# Patient Record
Sex: Female | Born: 1955 | Race: White | Hispanic: No | Smoking: Never smoker
Health system: Southern US, Community
[De-identification: ages and names within clinical notes are randomized; demographics above are authoritative.]

## PROBLEM LIST (undated history)

## (undated) DIAGNOSIS — M1990 Unspecified osteoarthritis, unspecified site: Secondary | ICD-10-CM

## (undated) DIAGNOSIS — C801 Malignant (primary) neoplasm, unspecified: Secondary | ICD-10-CM

## (undated) DIAGNOSIS — I10 Essential (primary) hypertension: Secondary | ICD-10-CM

## (undated) DIAGNOSIS — N209 Urinary calculus, unspecified: Secondary | ICD-10-CM

## (undated) DIAGNOSIS — K5792 Diverticulitis of intestine, part unspecified, without perforation or abscess without bleeding: Secondary | ICD-10-CM

## (undated) HISTORY — DX: Unspecified osteoarthritis, unspecified site: M19.90

## (undated) HISTORY — DX: Essential (primary) hypertension: I10

## (undated) HISTORY — DX: Urinary calculus, unspecified: N20.9

## (undated) HISTORY — DX: Malignant (primary) neoplasm, unspecified: C80.1

## (undated) HISTORY — DX: Diverticulitis of intestine, part unspecified, without perforation or abscess without bleeding: K57.92

---

## 1998-04-14 ENCOUNTER — Ambulatory Visit (HOSPITAL_COMMUNITY): Admission: RE | Admit: 1998-04-14 | Discharge: 1998-04-14 | Payer: Self-pay | Admitting: Obstetrics and Gynecology

## 1998-04-21 ENCOUNTER — Ambulatory Visit (HOSPITAL_COMMUNITY): Admission: RE | Admit: 1998-04-21 | Discharge: 1998-04-21 | Payer: Self-pay | Admitting: Obstetrics and Gynecology

## 1999-01-17 ENCOUNTER — Other Ambulatory Visit: Admission: RE | Admit: 1999-01-17 | Discharge: 1999-01-17 | Payer: Self-pay | Admitting: Obstetrics and Gynecology

## 1999-04-28 ENCOUNTER — Ambulatory Visit (HOSPITAL_COMMUNITY): Admission: RE | Admit: 1999-04-28 | Discharge: 1999-04-28 | Payer: Self-pay | Admitting: Obstetrics and Gynecology

## 1999-06-25 ENCOUNTER — Emergency Department (HOSPITAL_COMMUNITY): Admission: EM | Admit: 1999-06-25 | Discharge: 1999-06-25 | Payer: Self-pay | Admitting: Emergency Medicine

## 1999-06-25 ENCOUNTER — Encounter: Payer: Self-pay | Admitting: Emergency Medicine

## 2000-03-21 ENCOUNTER — Other Ambulatory Visit: Admission: RE | Admit: 2000-03-21 | Discharge: 2000-03-21 | Payer: Self-pay | Admitting: Obstetrics & Gynecology

## 2000-04-29 ENCOUNTER — Encounter: Payer: Self-pay | Admitting: Obstetrics and Gynecology

## 2000-04-29 ENCOUNTER — Ambulatory Visit (HOSPITAL_COMMUNITY): Admission: RE | Admit: 2000-04-29 | Discharge: 2000-04-29 | Payer: Self-pay | Admitting: Obstetrics and Gynecology

## 2001-05-06 ENCOUNTER — Encounter: Payer: Self-pay | Admitting: Obstetrics and Gynecology

## 2001-05-06 ENCOUNTER — Ambulatory Visit (HOSPITAL_COMMUNITY): Admission: RE | Admit: 2001-05-06 | Discharge: 2001-05-06 | Payer: Self-pay | Admitting: Obstetrics and Gynecology

## 2001-06-18 ENCOUNTER — Other Ambulatory Visit: Admission: RE | Admit: 2001-06-18 | Discharge: 2001-06-18 | Payer: Self-pay | Admitting: Obstetrics and Gynecology

## 2002-05-07 ENCOUNTER — Encounter: Payer: Self-pay | Admitting: Obstetrics and Gynecology

## 2002-05-07 ENCOUNTER — Ambulatory Visit (HOSPITAL_COMMUNITY): Admission: RE | Admit: 2002-05-07 | Discharge: 2002-05-07 | Payer: Self-pay | Admitting: Obstetrics and Gynecology

## 2002-06-24 ENCOUNTER — Other Ambulatory Visit: Admission: RE | Admit: 2002-06-24 | Discharge: 2002-06-24 | Payer: Self-pay | Admitting: Obstetrics and Gynecology

## 2002-12-10 DIAGNOSIS — C801 Malignant (primary) neoplasm, unspecified: Secondary | ICD-10-CM

## 2002-12-10 HISTORY — DX: Malignant (primary) neoplasm, unspecified: C80.1

## 2003-05-18 ENCOUNTER — Ambulatory Visit (HOSPITAL_COMMUNITY): Admission: RE | Admit: 2003-05-18 | Discharge: 2003-05-18 | Payer: Self-pay | Admitting: Obstetrics and Gynecology

## 2003-05-18 ENCOUNTER — Encounter: Payer: Self-pay | Admitting: Obstetrics and Gynecology

## 2004-05-22 ENCOUNTER — Ambulatory Visit (HOSPITAL_COMMUNITY): Admission: RE | Admit: 2004-05-22 | Discharge: 2004-05-22 | Payer: Self-pay | Admitting: Obstetrics and Gynecology

## 2004-08-01 ENCOUNTER — Other Ambulatory Visit: Admission: RE | Admit: 2004-08-01 | Discharge: 2004-08-01 | Payer: Self-pay | Admitting: Obstetrics and Gynecology

## 2005-04-26 ENCOUNTER — Encounter: Admission: RE | Admit: 2005-04-26 | Discharge: 2005-04-26 | Payer: Self-pay | Admitting: Obstetrics and Gynecology

## 2005-11-19 ENCOUNTER — Other Ambulatory Visit: Admission: RE | Admit: 2005-11-19 | Discharge: 2005-11-19 | Payer: Self-pay | Admitting: Obstetrics and Gynecology

## 2006-05-28 ENCOUNTER — Ambulatory Visit (HOSPITAL_COMMUNITY): Admission: RE | Admit: 2006-05-28 | Discharge: 2006-05-28 | Payer: Self-pay | Admitting: Obstetrics and Gynecology

## 2007-05-30 ENCOUNTER — Ambulatory Visit (HOSPITAL_COMMUNITY): Admission: RE | Admit: 2007-05-30 | Discharge: 2007-05-30 | Payer: Self-pay | Admitting: Obstetrics and Gynecology

## 2008-06-02 ENCOUNTER — Ambulatory Visit (HOSPITAL_COMMUNITY): Admission: RE | Admit: 2008-06-02 | Discharge: 2008-06-02 | Payer: Self-pay | Admitting: Obstetrics and Gynecology

## 2009-06-03 ENCOUNTER — Ambulatory Visit (HOSPITAL_COMMUNITY): Admission: RE | Admit: 2009-06-03 | Discharge: 2009-06-03 | Payer: Self-pay | Admitting: Obstetrics and Gynecology

## 2010-06-28 ENCOUNTER — Ambulatory Visit (HOSPITAL_COMMUNITY): Admission: RE | Admit: 2010-06-28 | Discharge: 2010-06-28 | Payer: Self-pay | Admitting: Obstetrics and Gynecology

## 2011-06-06 ENCOUNTER — Other Ambulatory Visit (HOSPITAL_COMMUNITY): Payer: Self-pay | Admitting: Obstetrics and Gynecology

## 2011-06-06 DIAGNOSIS — Z1231 Encounter for screening mammogram for malignant neoplasm of breast: Secondary | ICD-10-CM

## 2011-07-10 ENCOUNTER — Ambulatory Visit (HOSPITAL_COMMUNITY)
Admission: RE | Admit: 2011-07-10 | Discharge: 2011-07-10 | Disposition: A | Discharge status: Home / Self Care | Payer: BC Managed Care – PPO | Source: Ambulatory Visit | Attending: Obstetrics and Gynecology | Admitting: Obstetrics and Gynecology

## 2011-07-10 DIAGNOSIS — Z1231 Encounter for screening mammogram for malignant neoplasm of breast: Secondary | ICD-10-CM

## 2012-04-01 DIAGNOSIS — I10 Essential (primary) hypertension: Secondary | ICD-10-CM

## 2012-04-01 DIAGNOSIS — C4490 Unspecified malignant neoplasm of skin, unspecified: Secondary | ICD-10-CM | POA: Insufficient documentation

## 2012-04-01 DIAGNOSIS — N200 Calculus of kidney: Secondary | ICD-10-CM | POA: Insufficient documentation

## 2012-04-02 ENCOUNTER — Ambulatory Visit: Payer: Self-pay | Admitting: Obstetrics and Gynecology

## 2012-04-03 ENCOUNTER — Ambulatory Visit: Payer: Self-pay | Admitting: Obstetrics and Gynecology

## 2012-06-17 ENCOUNTER — Other Ambulatory Visit: Payer: Self-pay | Admitting: Obstetrics and Gynecology

## 2012-06-17 DIAGNOSIS — Z1231 Encounter for screening mammogram for malignant neoplasm of breast: Secondary | ICD-10-CM

## 2012-07-04 ENCOUNTER — Other Ambulatory Visit: Payer: Self-pay | Admitting: Family Medicine

## 2012-07-10 ENCOUNTER — Ambulatory Visit (HOSPITAL_COMMUNITY)
Admission: RE | Admit: 2012-07-10 | Discharge: 2012-07-10 | Disposition: A | Discharge status: Home / Self Care | Payer: BC Managed Care – PPO | Source: Ambulatory Visit | Attending: Obstetrics and Gynecology | Admitting: Obstetrics and Gynecology

## 2012-07-10 DIAGNOSIS — Z1231 Encounter for screening mammogram for malignant neoplasm of breast: Secondary | ICD-10-CM

## 2012-10-18 ENCOUNTER — Other Ambulatory Visit: Payer: Self-pay | Admitting: Physician Assistant

## 2012-12-19 ENCOUNTER — Encounter: Payer: Self-pay | Admitting: Family Medicine

## 2012-12-19 ENCOUNTER — Ambulatory Visit (INDEPENDENT_AMBULATORY_CARE_PROVIDER_SITE_OTHER): Payer: BC Managed Care – PPO | Admitting: Family Medicine

## 2012-12-19 VITALS — BP 150/90 | HR 63 | Temp 99.3°F | Resp 16 | Ht 61.0 in | Wt 148.8 lb

## 2012-12-19 DIAGNOSIS — IMO0001 Reserved for inherently not codable concepts without codable children: Secondary | ICD-10-CM

## 2012-12-19 DIAGNOSIS — Z79899 Other long term (current) drug therapy: Secondary | ICD-10-CM

## 2012-12-19 DIAGNOSIS — M791 Myalgia, unspecified site: Secondary | ICD-10-CM

## 2012-12-19 DIAGNOSIS — Z0000 Encounter for general adult medical examination without abnormal findings: Secondary | ICD-10-CM

## 2012-12-19 DIAGNOSIS — I10 Essential (primary) hypertension: Secondary | ICD-10-CM

## 2012-12-19 LAB — COMPREHENSIVE METABOLIC PANEL
ALT: 26 U/L (ref 0–35)
Alkaline Phosphatase: 116 U/L (ref 39–117)
BUN: 14 mg/dL (ref 6–23)
CO2: 29 mEq/L (ref 19–32)
Chloride: 104 mEq/L (ref 96–112)
Creat: 0.77 mg/dL (ref 0.50–1.10)
Potassium: 4 mEq/L (ref 3.5–5.3)
Sodium: 137 mEq/L (ref 135–145)
Total Bilirubin: 0.6 mg/dL (ref 0.3–1.2)

## 2012-12-19 LAB — CBC WITH DIFFERENTIAL/PLATELET
Basophils Absolute: 0.1 10*3/uL (ref 0.0–0.1)
Basophils Relative: 1 % (ref 0–1)
Eosinophils Absolute: 0.2 10*3/uL (ref 0.0–0.7)
Eosinophils Relative: 3 % (ref 0–5)
Hemoglobin: 13.9 g/dL (ref 12.0–15.0)
Lymphocytes Relative: 31 % (ref 12–46)
Lymphs Abs: 2 10*3/uL (ref 0.7–4.0)
Monocytes Absolute: 0.3 10*3/uL (ref 0.1–1.0)
Monocytes Relative: 5 % (ref 3–12)
Platelets: 230 10*3/uL (ref 150–400)
RBC: 4.53 MIL/uL (ref 3.87–5.11)
WBC: 6.3 10*3/uL (ref 4.0–10.5)

## 2012-12-19 LAB — C-REACTIVE PROTEIN: CRP: <0.5 mg/dL (ref <0.60)

## 2012-12-19 LAB — TSH: TSH: 1.006 u[IU]/mL (ref 0.350–4.500)

## 2012-12-19 NOTE — Patient Instructions (Signed)

## 2012-12-19 NOTE — Progress Notes (Signed)
Subjective:    Patient ID: Becky Mueller, female    DOB: 1956/04/09, 57 y.o.   MRN: [id number]  HPI  Has suspected that she has fibromyalgia w/ widespread joint and muscle paint for sev yrs.  Might also have arthritis as pain in hands. But muscles very stiff when she gets up after resting. Pressure points tender.  Has been living w/ it but wondering if it is time to augment w/ meds - at least trial. Did see chiropractor as has problems w/ back and neck.  No depression or insomnia so thought it was unlikely.  All of joints and muscles hurts.  Sits all day - but even just pain after 5 min. Gets better with exercises. Does not want to start med today (lyrica). Really wants to make effort to start getting reg exercises - wants to try yoga (although very painful.) No painful, swollen, hot joints as has RA in family. Not fasting. Has colonoscopy (first one) sched for 12/24/2012. Wants to loose some weight. H/o HTN and out of medication for > 1 mo and knows that not on med is usu 140-150/80-90.  Does great on hctz. Has gyn appt next mo, no h/o abnml.   Not tol ca supp - very constipate.  Drinks oj supp ca, cheese, milk, yogurt.  No mvi - does not tolerate. Did get flu shot this yr. Last tetanus prev to here - likey about 7-8 yrs prev. Past Medical History  Diagnosis Date  . Cancer 2004    skin  . Hypertension   . Urolithiasis 1980s  . Arthritis    Past Surgical History  Procedure Date  . Cesarean section     x 2   Family History  Problem Relation Age of Onset  . Cancer Sister 18    breast  . Arthritis Sister     RA  . Early death Paternal Uncle   . Hypertension Mother   . COPD Mother     emphysema   History   Social History  . Marital Status: Married    Spouse Name: N/A    Number of Children: N/A  . Years of Education: N/A   Social History Main Topics  . Smoking status: Never Smoker   . Smokeless tobacco: Never Used  . Alcohol Use: 0.0 oz/week    2-3 drink(s) per week  .  Drug Use: No  . Sexually Active: Yes   Other Topics Concern  . None   Social History Narrative  . None   Current Outpatient Prescriptions on File Prior to Visit  Medication Sig Dispense Refill  . hydrochlorothiazide (HYDRODIURIL) 25 MG tablet TAKE 1 TABLET BY MOUTH EVERY DAY  90 tablet  0   Allergies  Allergen Reactions  . Septra (Bactrim) Hives  . Tetracyclines & Related Hives     Review of Systems  Constitutional: Negative.   HENT: Negative.   Eyes: Negative.   Respiratory: Negative.   Cardiovascular: Negative.   Gastrointestinal: Negative.   Genitourinary: Negative.   Musculoskeletal: Positive for myalgias and arthralgias.  Skin: Negative.   Neurological: Negative.   Hematological: Negative.   Psychiatric/Behavioral: Negative.   All other systems reviewed and are negative.       Objective:   Physical Exam  Constitutional: She is oriented to person, place, and time. She appears well-developed and well-nourished. No distress.  HENT:  Head: Normocephalic and atraumatic.  Right Ear: Tympanic membrane, external ear and ear canal normal.  Left Ear: Tympanic membrane, external ear  and ear canal normal.  Nose: Nose normal. No mucosal edema or rhinorrhea.  Mouth/Throat: Uvula is midline, oropharynx is clear and moist and mucous membranes are normal. No posterior oropharyngeal erythema.  Eyes: Conjunctivae normal and EOM are normal. Pupils are equal, round, and reactive to light. Right eye exhibits no discharge. Left eye exhibits no discharge. No scleral icterus.  Neck: Normal range of motion. Neck supple. No thyromegaly present.  Cardiovascular: Normal rate, regular rhythm, normal heart sounds and intact distal pulses.   Pulmonary/Chest: Effort normal and breath sounds normal. No respiratory distress.  Abdominal: Soft. Bowel sounds are normal. There is no tenderness.  Musculoskeletal: She exhibits no edema.  Lymphadenopathy:    She has no cervical adenopathy.    Neurological: She is alert and oriented to person, place, and time. She has normal reflexes.  Skin: Skin is warm and dry. She is not diaphoretic. No erythema.  Psychiatric: She has a normal mood and affect. Her behavior is normal.       Assessment & Plan:   1. Routine general medical examination at a health care facility  Comprehensive metabolic panel, TSH, CBC with Differential - Pt had colonoscopy on 12/24/12 with 2 polyps and diverticulosis - repeat in 5-10 yrs depending on polyp types. Cont yearly mammograms due to breast cancer in sister.  2. Myalgia  Sedimentation rate, C-reactive protein, CK  3. HTN (hypertension)  Cont hctz  4. Encounter for long-term (current) use of other medications

## 2012-12-20 ENCOUNTER — Other Ambulatory Visit: Payer: Self-pay | Admitting: Physician Assistant

## 2013-01-16 ENCOUNTER — Ambulatory Visit: Payer: BC Managed Care – PPO | Admitting: Family Medicine

## 2013-02-03 ENCOUNTER — Ambulatory Visit: Payer: BC Managed Care – PPO | Admitting: Obstetrics and Gynecology

## 2013-02-03 ENCOUNTER — Encounter: Payer: Self-pay | Admitting: Obstetrics and Gynecology

## 2013-02-03 VITALS — BP 124/66 | Ht 61.0 in | Wt 147.0 lb

## 2013-02-03 DIAGNOSIS — Z01419 Encounter for gynecological examination (general) (routine) without abnormal findings: Secondary | ICD-10-CM

## 2013-02-03 NOTE — Progress Notes (Signed)
The patient is not taking hormone replacement therapy The patient  is not taking a Calcium supplement. Post-menopausal bleeding:no  Last Pap:04/03/2011 Last mammogram:07/11/2012 Normal Last DEXA scan :n/a      Last colonoscopy:per pt 12/2012 Polyps x 2 1) Precancerous ? Repeat every 5 years  Urinary symptoms: none Normal bowel movements: Yes Reports abuse at home: No:   Subjective:    Becky Mueller is a 57 y.o. female [phone] who presents for annual exam.  The patient has no complaints today.   The following portions of the patient's history were reviewed and updated as appropriate: allergies, current medications, past family history, past medical history, past social history, past surgical history and problem list.  Review of Systems Pertinent items are noted in HPI. Gastrointestinal:No change in bowel habits, no abdominal pain, no rectal bleeding Genitourinary:negative for dysuria, frequency, hematuria, nocturia and urinary incontinence    Objective:     BP 124/66  Ht 5\' 1"  (1.549 m)  Wt 147 lb (66.679 kg)  BMI 27.79 kg/m2  LMP 07/24/2011  Weight:  Wt Readings from Last 1 Encounters:  02/03/13 147 lb (66.679 kg)     BMI: Body mass index is 27.79 kg/(m^2). General Appearance: Alert, appropriate appearance for age. No acute distress HEENT: Grossly normal Neck / Thyroid: Supple, no masses, nodes or enlargement Lungs: clear to auscultation bilaterally Back: No CVA tenderness Breast Exam: No masses or nodes.No dimpling, nipple retraction or discharge. Cardiovascular: Regular rate and rhythm. S1, S2, no murmur Gastrointestinal: Soft, non-tender, no masses or organomegaly Pelvic Exam: Vulva and vagina appear normal. Bimanual exam reveals normal uterus and adnexa. Rectovaginal: deferred due to recent colonoscopy Lymphatic Exam: Non-palpable nodes in neck, clavicular, axillary, or inguinal regions Skin: no rash or abnormalities Neurologic: Normal gait and speech, no tremor   Psychiatric: Alert and oriented, appropriate affect.   Assessment:    Normal gyn exam  Newly separated from husband of 15 years and doing very well   Plan:   mammogram pap smear return annually or prn Menopause Symptoms Discussed Declines HRT  Silverio Lay MD

## 2013-02-04 LAB — PAP IG W/ RFLX HPV ASCU

## 2013-04-09 ENCOUNTER — Other Ambulatory Visit: Payer: Self-pay | Admitting: Physician Assistant

## 2013-07-02 ENCOUNTER — Other Ambulatory Visit: Payer: Self-pay | Admitting: Obstetrics and Gynecology

## 2013-07-02 DIAGNOSIS — Z1231 Encounter for screening mammogram for malignant neoplasm of breast: Secondary | ICD-10-CM

## 2013-07-11 ENCOUNTER — Other Ambulatory Visit: Payer: Self-pay | Admitting: Physician Assistant

## 2013-07-14 ENCOUNTER — Other Ambulatory Visit: Payer: Self-pay | Admitting: Family Medicine

## 2013-07-23 ENCOUNTER — Ambulatory Visit (HOSPITAL_COMMUNITY)
Admission: RE | Admit: 2013-07-23 | Discharge: 2013-07-23 | Disposition: A | Discharge status: Home / Self Care | Payer: BC Managed Care – PPO | Source: Ambulatory Visit | Attending: Obstetrics and Gynecology | Admitting: Obstetrics and Gynecology

## 2013-07-23 DIAGNOSIS — Z1231 Encounter for screening mammogram for malignant neoplasm of breast: Secondary | ICD-10-CM

## 2013-08-11 ENCOUNTER — Other Ambulatory Visit: Payer: Self-pay | Admitting: Physician Assistant

## 2013-08-11 ENCOUNTER — Telehealth: Payer: Self-pay

## 2013-08-11 DIAGNOSIS — Z79899 Other long term (current) drug therapy: Secondary | ICD-10-CM

## 2013-08-11 NOTE — Telephone Encounter (Signed)
Dr. Clelia Croft - Pt saw you on January 10 this year.  When she went to get her hydrodiuril filled they told her she needed an OV.  She says that she usually has a year-long refill on this.  Wants to know why that has changed or was it a mistake?  Please call [phone]

## 2013-08-11 NOTE — Telephone Encounter (Signed)
Can she have more refills?

## 2013-08-12 MED ORDER — HYDROCHLOROTHIAZIDE 25 MG PO TABS: ORAL_TABLET | ORAL | Status: DC

## 2013-08-12 NOTE — Telephone Encounter (Signed)
Called her to advise. Left message for her to call me back.  

## 2013-08-12 NOTE — Telephone Encounter (Signed)
When she came in to clinic, she had been out of the medication for over a month so her blood pressure was much to high so I wanted her to come back in 1 month after restarting the medication so we could make sure that the dose was effective to bring her BP down to where it needs to be and to check labs to ensure that the medication was not adversely affecting her kidneys and salts in her blood.  I see her BP at her gynecologist office was fine. Lab only order placed for bmp that pt can come have drawn at her convenience and med refilled.  In the future, it will be easier for her if she is still on the medication at the time of the visit.

## 2013-08-16 NOTE — Telephone Encounter (Signed)
LMOM to CB. 

## 2013-08-17 ENCOUNTER — Telehealth: Payer: Self-pay

## 2013-08-17 NOTE — Telephone Encounter (Signed)
PT WOULD LIKE TO SPEAK WITH SOMEONE REGARDING HER MEDICATION. PLEASE CALL [phone]

## 2013-08-17 NOTE — Telephone Encounter (Signed)
Called her, left message for call back

## 2013-08-18 NOTE — Telephone Encounter (Signed)
Patient has not returned our calls

## 2013-08-20 NOTE — Telephone Encounter (Signed)
Called her, she states she does not need anything, she is taking HCTZ, she indicates she can not come in for recheck in 6 months her insurance will not cover this, she wants to know if you can fill the meds and she can come in yearly her BP have been about 120/80. Advised her the Rx was sent in for 6 months, so she will be fine for 6 more months. Patient will come in Jan for recheck

## 2013-10-15 ENCOUNTER — Other Ambulatory Visit: Payer: Self-pay

## 2014-06-23 ENCOUNTER — Other Ambulatory Visit: Payer: Self-pay | Admitting: Family Medicine

## 2014-06-23 NOTE — Telephone Encounter (Signed)
Phone call to patient, have gotten med refill request/ she is very overdue for follow up, what is her follow up plan? Left message

## 2014-06-24 NOTE — Telephone Encounter (Signed)
TRIED TO CALL PT- UNABLE TO LEAVE MESSAGE NEEDS APPT FOR FURTHER REFILLS

## 2014-07-16 ENCOUNTER — Ambulatory Visit (INDEPENDENT_AMBULATORY_CARE_PROVIDER_SITE_OTHER): Payer: BC Managed Care – PPO | Admitting: Family Medicine

## 2014-07-16 ENCOUNTER — Encounter: Payer: Self-pay | Admitting: Family Medicine

## 2014-07-16 ENCOUNTER — Other Ambulatory Visit: Payer: Self-pay | Admitting: Family Medicine

## 2014-07-16 VITALS — BP 106/70 | HR 71 | Temp 97.9°F | Resp 16 | Ht 61.5 in | Wt 145.6 lb

## 2014-07-16 DIAGNOSIS — Z79899 Other long term (current) drug therapy: Secondary | ICD-10-CM

## 2014-07-16 DIAGNOSIS — M129 Arthropathy, unspecified: Secondary | ICD-10-CM

## 2014-07-16 DIAGNOSIS — M1990 Unspecified osteoarthritis, unspecified site: Secondary | ICD-10-CM

## 2014-07-16 DIAGNOSIS — E663 Overweight: Secondary | ICD-10-CM

## 2014-07-16 DIAGNOSIS — G8929 Other chronic pain: Secondary | ICD-10-CM

## 2014-07-16 DIAGNOSIS — I10 Essential (primary) hypertension: Secondary | ICD-10-CM

## 2014-07-16 LAB — TSH: TSH: 1.105 u[IU]/mL (ref 0.350–4.500)

## 2014-07-16 LAB — COMPREHENSIVE METABOLIC PANEL
ALBUMIN: 4.1 g/dL (ref 3.5–5.2)
ALT: 12 U/L (ref 0–35)
AST: 15 U/L (ref 0–37)
Alkaline Phosphatase: 93 U/L (ref 39–117)
BILIRUBIN TOTAL: 0.6 mg/dL (ref 0.2–1.2)
BUN: 17 mg/dL (ref 6–23)
CALCIUM: 9.8 mg/dL (ref 8.4–10.5)
CO2: 30 meq/L (ref 19–32)
Chloride: 102 mEq/L (ref 96–112)
Creat: 0.77 mg/dL (ref 0.50–1.10)
Glucose, Bld: 145 mg/dL — ABNORMAL HIGH (ref 70–99)
POTASSIUM: 3.7 meq/L (ref 3.5–5.3)
SODIUM: 142 meq/L (ref 135–145)
TOTAL PROTEIN: 7.1 g/dL (ref 6.0–8.3)

## 2014-07-16 LAB — CBC
HCT: 40.5 % (ref 36.0–46.0)
Hemoglobin: 14 g/dL (ref 12.0–15.0)
MCH: 30.7 pg (ref 26.0–34.0)
MCHC: 34.6 g/dL (ref 30.0–36.0)
MCV: 88.8 fL (ref 78.0–100.0)
Platelets: 235 10*3/uL (ref 150–400)
RBC: 4.56 MIL/uL (ref 3.87–5.11)
RDW: 12.8 % (ref 11.5–15.5)
WBC: 5.2 10*3/uL (ref 4.0–10.5)

## 2014-07-16 MED ORDER — HYDROCHLOROTHIAZIDE 25 MG PO TABS: ORAL_TABLET | ORAL | Status: DC

## 2014-07-16 NOTE — Progress Notes (Deleted)
Subjective:  This chart was scribed for Delman Cheadle, MD by Thea Alken, ED Scribe. This patient was seen in room 25 and the patient's care was started at 8:26 AM.   Patient ID: Becky Mueller, female    DOB: May 12, 1956, 58 y.o.   MRN: [id number]  HPI Chief Complaint  Patient presents with   Medication Refill    HCTZ   HPI Comments: Becky Mueller is a 58 y.o. female who presents to the [facility] for a medication refill regarding hydrochlorothiazide. Pt is still taking medication and reports it is working well. She denies checking BP outside the office. Pt has a BP cuff at home but is unsure where it is. She reports she is willing to find it and check her BP at home twice a day. She feels at time her BP is low due to feeling dizzy esp with fast position changes.   Pt report she is still in pain. Pt believes this pain may be arthritis or fibromyalgia. She reports pain is relieved when on a gluten free diet, as she has done this multiple times in the past. She has a goal to start exercising. Pt reports her weight has been steady but would like to lose 2 lbs a month. Pt goal weight is 130 lbs.   Past Medical History  Diagnosis Date   Cancer 2004    skin   Hypertension    Urolithiasis 1980s   Arthritis    Diverticulitis    Past Surgical History  Procedure Laterality Date   Cesarean section      x 2   Prior to Admission medications   Medication Sig Start Date End Date Taking? Authorizing Provider  hydrochlorothiazide (HYDRODIURIL) 25 MG tablet TAKE 1 TABLET DAILY NEED OFFICE VISIT FOR FURTHER REFILLS 06/24/14   Fara Chute, PA-C   Review of Systems  Constitutional: Negative for fever, chills, activity change, appetite change and unexpected weight change.  Respiratory: Negative for chest tightness and shortness of breath.   Cardiovascular: Negative for chest pain, palpitations and leg swelling.  Musculoskeletal: Positive for arthralgias and myalgias.    Neurological: Positive for dizziness and light-headedness. Negative for headaches.    Objective:   Physical Exam  Nursing note and vitals reviewed. Constitutional: She is oriented to person, place, and time. She appears well-developed and well-nourished. No distress.  HENT:  Head: Normocephalic and atraumatic.  Eyes: Conjunctivae and EOM are normal.  Neck: Normal range of motion. Neck supple. No thyromegaly present.  Cardiovascular: Normal rate, regular rhythm and normal heart sounds.  Exam reveals no gallop and no friction rub.   No murmur heard. Pulmonary/Chest: Effort normal and breath sounds normal. No respiratory distress. She has no wheezes. She has no rales. She exhibits no tenderness.  Musculoskeletal: Normal range of motion.  Lymphadenopathy:    She has no cervical adenopathy.  Neurological: She is alert and oriented to person, place, and time.  Skin: Skin is warm and dry.  Psychiatric: She has a normal mood and affect. Her behavior is normal.   Filed Vitals:   07/16/14 0806  BP: 106/70  Pulse: 71  Temp: 97.9 F (36.6 C)  Resp: 16      Assessment & Plan:   1. Essential hypertension   2. Chronic pain   3. Arthritis   4. Encounter for long-term (current) use of other medications   5. Overweight (BMI 25.0-29.9)   8:41 AM-Discussed treatment plan which includes taking half of  HCTZ tablet daily, and monitor BP at home. If BP is doing well pt can stop taking HCTZ and continue monitoring BP. If BP increases pt has been advised to increase dose back to full 25 mg of HCTZ..  Meds ordered this encounter  Medications   hydrochlorothiazide (HYDRODIURIL) 25 MG tablet    Sig: TAKE 1/2 TABLET DAILY    Dispense:  30 tablet    Refill:  0   Orders Placed This Encounter  Procedures   Comprehensive metabolic panel   CBC   TSH   I personally performed the services described in this documentation, which was scribed in my presence. The recorded information has been  reviewed and considered, and addended by me as needed.  Delman Cheadle, MD MPH

## 2014-07-16 NOTE — Patient Instructions (Addendum)
Cut the hctz in 1/2 for about 3-4 weeks and check BP 1-2x/wk.  As long as it is remaining <120/80 most of the time, then try stopping it all together and continue to monitor your blood pressure. Please email through [facility] and let me know how it is going and what your pressures are running generally. If you end up needing to be on the hctz or even just 1/2 tab daily, let me know and I will send in a years supply for you. Loosing 20 lbs in the next year could be HUGE for you as far as decreased pain and increased energy.  Exercise for 30-40 minutes at lease over other day. Managing Your High Blood Pressure Blood pressure is a measurement of how forceful your blood is pressing against the walls of the arteries. Arteries are muscular tubes within the circulatory system. Blood pressure does not stay the same. Blood pressure rises when you are active, excited, or nervous; and it lowers during sleep and relaxation. If the numbers measuring your blood pressure stay above normal most of the time, you are at risk for health problems. High blood pressure (hypertension) is a long-term (chronic) condition in which blood pressure is elevated. A blood pressure reading is recorded as two numbers, such as 120 over 80 (or 120/80). The first, higher number is called the systolic pressure. It is a measure of the pressure in your arteries as the heart beats. The second, lower number is called the diastolic pressure. It is a measure of the pressure in your arteries as the heart relaxes between beats.  Keeping your blood pressure in a normal range is important to your overall health and prevention of health problems, such as heart disease and stroke. When your blood pressure is uncontrolled, your heart has to work harder than normal. High blood pressure is a very common condition in adults because blood pressure tends to rise with age. Men and women are equally likely to have hypertension but at different times in life. Before age  29, men are more likely to have hypertension. After 56 years of age, women are more likely to have it. Hypertension is especially common in African Americans. This condition often has no signs or symptoms. The cause of the condition is usually not known. Your caregiver can help you come up with a plan to keep your blood pressure in a normal, healthy range. BLOOD PRESSURE STAGES Blood pressure is classified into four stages: normal, prehypertension, stage 1, and stage 2. Your blood pressure reading will be used to determine what type of treatment, if any, is necessary. Appropriate treatment options are tied to these four stages:  Normal  Systolic pressure (mm Hg): below 120.  Diastolic pressure (mm Hg): below 80. Prehypertension  Systolic pressure (mm Hg): 120 to 139.  Diastolic pressure (mm Hg): 80 to 89. Stage1  Systolic pressure (mm Hg): 140 to 159.  Diastolic pressure (mm Hg): 90 to 99. Stage2  Systolic pressure (mm Hg): 160 or above.  Diastolic pressure (mm Hg): 100 or above. RISKS RELATED TO HIGH BLOOD PRESSURE Managing your blood pressure is an important responsibility. Uncontrolled high blood pressure can lead to:  A heart attack.  A stroke.  A weakened blood vessel (aneurysm).  Heart failure.  Kidney damage.  Eye damage.  Metabolic syndrome.  Memory and concentration problems. HOW TO MANAGE YOUR BLOOD PRESSURE Blood pressure can be managed effectively with lifestyle changes and medicines (if needed). Your caregiver will help you come up with a  plan to bring your blood pressure within a normal range. Your plan should include the following: Education  Read all information provided by your caregivers about how to control blood pressure.  Educate yourself on the latest guidelines and treatment recommendations. New research is always being done to further define the risks and treatments for high blood pressure. Lifestylechanges  Control your weight.  Avoid  smoking.  Stay physically active.  Reduce the amount of salt in your diet.  Reduce stress.  Control any chronic conditions, such as high cholesterol or diabetes.  Reduce your alcohol intake. Medicines  Several medicines (antihypertensive medicines) are available, if needed, to bring blood pressure within a normal range. Communication  Review all the medicines you take with your caregiver because there may be side effects or interactions.  Talk with your caregiver about your diet, exercise habits, and other lifestyle factors that may be contributing to high blood pressure.  See your caregiver regularly. Your caregiver can help you create and adjust your plan for managing high blood pressure. RECOMMENDATIONS FOR TREATMENT AND FOLLOW-UP  The following recommendations are based on current guidelines for managing high blood pressure in nonpregnant adults. Use these recommendations to identify the proper follow-up period or treatment option based on your blood pressure reading. You can discuss these options with your caregiver.  Systolic pressure of 976 to 734 or diastolic pressure of 80 to 89: Follow up with your caregiver as directed.  Systolic pressure of 193 to 790 or diastolic pressure of 90 to 100: Follow up with your caregiver within 2 months.  Systolic pressure above 240 or diastolic pressure above 973: Follow up with your caregiver within 1 month.  Systolic pressure above 532 or diastolic pressure above 992: Consider antihypertensive therapy; follow up with your caregiver within 1 week.  Systolic pressure above 426 or diastolic pressure above 834: Begin antihypertensive therapy; follow up with your caregiver within 1 week. Document Released: 08/20/2012 Document Reviewed: 08/20/2012 [facility] Patient Information 2015 [place]. This information is not intended to replace advice given to you by your health care provider. Make sure you discuss any questions you have with your  health care provider. Hypertension Hypertension, commonly called high blood pressure, is when the force of blood pumping through your arteries is too strong. Your arteries are the blood vessels that carry blood from your heart throughout your body. A blood pressure reading consists of a higher number over a lower number, such as 110/72. The higher number (systolic) is the pressure inside your arteries when your heart pumps. The lower number (diastolic) is the pressure inside your arteries when your heart relaxes. Ideally you want your blood pressure below 120/80. Hypertension forces your heart to work harder to pump blood. Your arteries may become narrow or stiff. Having hypertension puts you at risk for heart disease, stroke, and other problems.  RISK FACTORS Some risk factors for high blood pressure are controllable. Others are not.  Risk factors you cannot control include:   Race. You may be at higher risk if you are African American.  Age. Risk increases with age.  Gender. Men are at higher risk than women before age 85 years. After age 90, women are at higher risk than men. Risk factors you can control include:  Not getting enough exercise or physical activity.  Being overweight.  Getting too much fat, sugar, calories, or salt in your diet.  Drinking too much alcohol. SIGNS AND SYMPTOMS Hypertension does not usually cause signs or symptoms. Extremely high  blood pressure (hypertensive crisis) may cause headache, anxiety, shortness of breath, and nosebleed. DIAGNOSIS  To check if you have hypertension, your health care provider will measure your blood pressure while you are seated, with your arm held at the level of your heart. It should be measured at least twice using the same arm. Certain conditions can cause a difference in blood pressure between your right and left arms. A blood pressure reading that is higher than normal on one occasion does not mean that you need treatment. If one  blood pressure reading is high, ask your health care provider about having it checked again. TREATMENT  Treating high blood pressure includes making lifestyle changes and possibly taking medicine. Living a healthy lifestyle can help lower high blood pressure. You may need to change some of your habits. Lifestyle changes may include:  Following the DASH diet. This diet is high in fruits, vegetables, and whole grains. It is low in salt, red meat, and added sugars.  Getting at least 2 hours of brisk physical activity every week.  Losing weight if necessary.  Not smoking.  Limiting alcoholic beverages.  Learning ways to reduce stress. If lifestyle changes are not enough to get your blood pressure under control, your health care provider may prescribe medicine. You may need to take more than one. Work closely with your health care provider to understand the risks and benefits. HOME CARE INSTRUCTIONS  Have your blood pressure rechecked as directed by your health care provider.   Take medicines only as directed by your health care provider. Follow the directions carefully. Blood pressure medicines must be taken as prescribed. The medicine does not work as well when you skip doses. Skipping doses also puts you at risk for problems.   Do not smoke.   Monitor your blood pressure at home as directed by your health care provider. SEEK MEDICAL CARE IF:   You think you are having a reaction to medicines taken.  You have recurrent headaches or feel dizzy.  You have swelling in your ankles.  You have trouble with your vision. SEEK IMMEDIATE MEDICAL CARE IF:  You develop a severe headache or confusion.  You have unusual weakness, numbness, or feel faint.  You have severe chest or abdominal pain.  You vomit repeatedly.  You have trouble breathing. MAKE SURE YOU:   Understand these instructions.  Will watch your condition.  Will get help right away if you are not doing well or  get worse. Document Released: 11/26/2005 Document Revised: 04/12/2014 Document Reviewed: 09/18/2013 [facility] Patient Information 2015 [place], [place]. This information is not intended to replace advice given to you by your health care provider. Make sure you discuss any questions you have with your health care provider.

## 2014-07-19 LAB — HEMOGLOBIN A1C
HEMOGLOBIN A1C: 6.4 % — AB (ref <5.7)
MEAN PLASMA GLUCOSE: 137 mg/dL — AB (ref <117)

## 2014-07-19 NOTE — Progress Notes (Signed)
Subjective:  This chart was scribed for Delman Cheadle, MD by Thea Alken, ED Scribe. This patient was seen in room 25 and the patient's care was started at 8:26 AM.   Patient ID: Becky Mueller, female    DOB: 01-03-1956, 58 y.o.   MRN: [id number]  HPI Chief Complaint  Patient presents with  . Medication Refill    HCTZ   HPI Comments: Becky Mueller is a 58 y.o. female who presents to the [facility] for a medication refill regarding hydrochlorothiazide. Pt is still taking medication and reports it is working well. She denies checking BP outside the office. Pt has a BP cuff at home but is unsure where it is. She reports she is willing to find it and check her BP at home twice a day. She feels at time her BP is low due to feeling dizzy esp with fast position changes.   Pt report she is still in pain. Pt believes this pain may be arthritis or fibromyalgia. She reports pain is relieved when on a gluten free diet, as she has done this multiple times in the past. She has a goal to start exercising. Pt reports her weight has been steady but would like to lose 2 lbs a month. Pt goal weight is 130 lbs.   Past Medical History  Diagnosis Date  . Cancer 2004    skin  . Hypertension   . Urolithiasis 1980s  . Arthritis   . Diverticulitis    Past Surgical History  Procedure Laterality Date  . Cesarean section      x 2   Prior to Admission medications   Medication Sig Start Date End Date Taking? Authorizing Provider  hydrochlorothiazide (HYDRODIURIL) 25 MG tablet TAKE 1 TABLET DAILY NEED OFFICE VISIT FOR FURTHER REFILLS 06/24/14   Fara Chute, PA-C   Review of Systems  Constitutional: Negative for fever, chills, activity change, appetite change and unexpected weight change.  Respiratory: Negative for chest tightness and shortness of breath.   Cardiovascular: Negative for chest pain, palpitations and leg swelling.  Musculoskeletal: Positive for arthralgias and myalgias.    Neurological: Positive for dizziness and light-headedness. Negative for headaches.    Objective:   Physical Exam  Nursing note and vitals reviewed. Constitutional: She is oriented to person, place, and time. She appears well-developed and well-nourished. No distress.  HENT:  Head: Normocephalic and atraumatic.  Eyes: Conjunctivae and EOM are normal.  Neck: Normal range of motion. Neck supple. No thyromegaly present.  Cardiovascular: Normal rate, regular rhythm and normal heart sounds.  Exam reveals no gallop and no friction rub.   No murmur heard. Pulmonary/Chest: Effort normal and breath sounds normal. No respiratory distress. She has no wheezes. She has no rales. She exhibits no tenderness.  Musculoskeletal: Normal range of motion.  Lymphadenopathy:    She has no cervical adenopathy.  Neurological: She is alert and oriented to person, place, and time.  Skin: Skin is warm and dry.  Psychiatric: She has a normal mood and affect. Her behavior is normal.   Filed Vitals:   07/16/14 0806  BP: 106/70  Pulse: 71  Temp: 97.9 F (36.6 C)  Resp: 16      Assessment & Plan:   1. Essential hypertension   2. Chronic pain   3. Arthritis   4. Encounter for long-term (current) use of other medications   5. Overweight (BMI 25.0-29.9)   8:41 AM-Discussed treatment plan which includes taking half of  HCTZ tablet daily, and monitor BP at home. If BP is doing well pt can stop taking HCTZ and continue monitoring BP. If BP increases pt has been advised to increase dose back to full 25 mg of HCTZ..  Meds ordered this encounter  Medications  . hydrochlorothiazide (HYDRODIURIL) 25 MG tablet    Sig: TAKE 1/2 TABLET DAILY    Dispense:  30 tablet    Refill:  0   Orders Placed This Encounter  Procedures  . Comprehensive metabolic panel  . CBC  . TSH   I personally performed the services described in this documentation, which was scribed in my presence. The recorded information has been  reviewed and considered, and addended by me as needed.  Delman Cheadle, MD MPH

## 2014-07-20 ENCOUNTER — Ambulatory Visit (INDEPENDENT_AMBULATORY_CARE_PROVIDER_SITE_OTHER): Payer: BC Managed Care – PPO | Admitting: Family Medicine

## 2014-07-20 VITALS — BP 108/72 | HR 66 | Temp 97.8°F | Resp 16 | Ht 61.5 in | Wt 147.0 lb

## 2014-07-20 DIAGNOSIS — R300 Dysuria: Secondary | ICD-10-CM

## 2014-07-20 DIAGNOSIS — R3129 Other microscopic hematuria: Secondary | ICD-10-CM

## 2014-07-20 LAB — POCT URINALYSIS DIPSTICK
BILIRUBIN UA: NEGATIVE
GLUCOSE UA: NEGATIVE
KETONES UA: NEGATIVE
Leukocytes, UA: NEGATIVE
NITRITE UA: NEGATIVE
PH UA: 5.5
PROTEIN UA: NEGATIVE
Spec Grav, UA: 1.01
UROBILINOGEN UA: 0.2

## 2014-07-20 LAB — POCT UA - MICROSCOPIC ONLY
CASTS, UR, LPF, POC: NEGATIVE
Crystals, Ur, HPF, POC: NEGATIVE
Yeast, UA: NEGATIVE

## 2014-07-20 MED ORDER — NITROFURANTOIN MONOHYD MACRO 100 MG PO CAPS: 100.0000 mg | ORAL_CAPSULE | Freq: Two times a day (BID) | ORAL | Status: DC

## 2014-07-20 NOTE — Progress Notes (Signed)
[facility] [address], [place] [address] [phone]  Date:  07/20/2014   Name:  Becky Mueller   DOB:  October 04, 1956   MRN:  [id number]  PCP:  Delman Cheadle, MD    Chief Complaint: Dysuria   History of Present Illness:  Becky Mueller is a 58 y.o. very pleasant female patient who presents with the following:  Here today with urinary concern.  She had noted the beginning of possible UTI sx last night. She did not sleep well, and woke up early with urinary sx.  She has some dysuria this am, but seems better with tylenol.   This does seem like UTIs she has had in the past before.   She has not yet noted any urinary frequency.  No fever, back pain or abdominal pain She does have a distant history of kidney stones and was not sure if this might indicate a stone.    Generally healthy except for mild HTN.  No vaginal sx.   She last had a stone in the 1980s. She has had lithotripsy in the past.  Never had a stone again  Patient Active Problem List   Diagnosis Date Noted  . HTN (hypertension) 04/01/2012  . Skin cancer 04/01/2012  . Kidney stones 04/01/2012    Past Medical History  Diagnosis Date  . Cancer 2004    skin  . Hypertension   . Urolithiasis 1980s  . Arthritis   . Diverticulitis     Past Surgical History  Procedure Laterality Date  . Cesarean section      x 2    History  Substance Use Topics  . Smoking status: Never Smoker   . Smokeless tobacco: Never Used  . Alcohol Use: 0.0 oz/week    2-3 drink(s) per week    Family History  Problem Relation Age of Onset  . Cancer Sister 41    breast  . Arthritis Sister     RA  . Early death Paternal Uncle   . Hypertension Mother   . COPD Mother     emphysema    Allergies  Allergen Reactions  . Septra [Bactrim] Hives  . Tetracyclines & Related Hives    Medication list has been reviewed and updated.  Current Outpatient Prescriptions on File Prior to Visit  Medication Sig Dispense Refill  .  hydrochlorothiazide (HYDRODIURIL) 25 MG tablet TAKE 1/2 TABLET DAILY  30 tablet  0   No current facility-administered medications on file prior to visit.    Review of Systems:  As per HPI- otherwise negative.   Physical Examination: Filed Vitals:   07/20/14 0814  BP: 108/72  Pulse: 66  Temp: 97.8 F (36.6 C)  Resp: 16   Filed Vitals:   07/20/14 0814  Height: 5' 1.5" (1.562 m)  Weight: 147 lb (66.679 kg)   Body mass index is 27.33 kg/(m^2). Ideal Body Weight: Weight in (lb) to have BMI = 25: 134.2  GEN: WDWN, NAD, Non-toxic, A & O x 3 HEENT: Atraumatic, Normocephalic. Neck supple. No masses, No LAD. Ears and Nose: No external deformity. CV: RRR, No M/G/R. No JVD. No thrill. No extra heart sounds. PULM: CTA B, no wheezes, crackles, rhonchi. No retractions. No resp. distress. No accessory muscle use. ABD: S, NT, ND, +BS. No rebound. No HSM.  abd is benign  EXTR: No c/c/e NEURO Normal gait.  PSYCH: Normally interactive. Conversant. Not depressed or anxious appearing.  Calm demeanor.  No CVA tenderness   Results for  orders placed in visit on 07/20/14  POCT URINALYSIS DIPSTICK      Result Value Ref Range   Color, UA yellow     Clarity, UA clear     Glucose, UA neg     Bilirubin, UA neg     Ketones, UA neg     Spec Grav, UA 1.010     Blood, UA mod     pH, UA 5.5     Protein, UA neg     Urobilinogen, UA 0.2     Nitrite, UA neg     Leukocytes, UA Negative    POCT UA - MICROSCOPIC ONLY      Result Value Ref Range   WBC, Ur, HPF, POC 0-1     RBC, urine, microscopic 2-5     Bacteria, U Microscopic trace     Mucus, UA trace     Epithelial cells, urine per micros 0-1     Crystals, Ur, HPF, POC neg     Casts, Ur, LPF, POC neg     Yeast, UA neg      Assessment and Plan: Dysuria - Plan: POCT urinalysis dipstick, POCT UA - Microscopic Only, Urine culture, nitrofurantoin, macrocrystal-monohydrate, (MACROBID) 100 MG capsule   Treat for likely UTI with macrobid.   See  patient instructions for more details.   Await culture Signed Lamar Blinks, MD

## 2014-07-20 NOTE — Patient Instructions (Signed)
Use the macrobid antibiotic as directed- twice a day for one week.  Drink plenty of water, and you can buy some pyridium OTC for discomfort.  I will be in touch with your urine culture.  Let me know if you are getting worse over the next couple of days

## 2014-07-21 LAB — URINE CULTURE
COLONY COUNT: NO GROWTH
Organism ID, Bacteria: NO GROWTH

## 2014-07-29 NOTE — Progress Notes (Signed)
Called to discuss with her.  Her urine culture is negative- this leaves [place] with unexplained microhematuria.  The most conservative next step is to have her see urology for full evaluation to rule out bladder cancer, etc.  At this time she declines this, but she will come in for a repeat urine in about one month- lab only order placed

## 2014-07-29 NOTE — Addendum Note (Signed)
Addended by: Lamar Blinks C on: 07/29/2014 03:42 PM   Modules accepted: Orders

## 2014-08-03 ENCOUNTER — Other Ambulatory Visit: Payer: Self-pay

## 2014-08-03 DIAGNOSIS — Z1231 Encounter for screening mammogram for malignant neoplasm of breast: Secondary | ICD-10-CM

## 2014-08-13 ENCOUNTER — Ambulatory Visit
Admission: RE | Admit: 2014-08-13 | Discharge: 2014-08-13 | Disposition: A | Discharge status: Home / Self Care | Payer: BC Managed Care – PPO | Source: Ambulatory Visit

## 2014-08-13 DIAGNOSIS — Z1231 Encounter for screening mammogram for malignant neoplasm of breast: Secondary | ICD-10-CM

## 2014-09-02 ENCOUNTER — Other Ambulatory Visit: Payer: Self-pay | Admitting: Family Medicine

## 2014-09-02 ENCOUNTER — Telehealth (INDEPENDENT_AMBULATORY_CARE_PROVIDER_SITE_OTHER): Payer: BC Managed Care – PPO

## 2014-09-02 ENCOUNTER — Other Ambulatory Visit: Payer: BC Managed Care – PPO | Admitting: *Deleted

## 2014-09-02 DIAGNOSIS — R3129 Other microscopic hematuria: Secondary | ICD-10-CM

## 2014-09-02 DIAGNOSIS — R300 Dysuria: Secondary | ICD-10-CM

## 2014-09-02 LAB — POCT URINALYSIS DIPSTICK
Bilirubin, UA: NEGATIVE
GLUCOSE UA: NEGATIVE
KETONES UA: NEGATIVE
Leukocytes, UA: NEGATIVE
Nitrite, UA: NEGATIVE
Protein, UA: NEGATIVE
SPEC GRAV UA: 1.01
Urobilinogen, UA: 0.2
pH, UA: 7

## 2014-09-02 LAB — POCT UA - MICROSCOPIC ONLY
BACTERIA, U MICROSCOPIC: NEGATIVE
Casts, Ur, LPF, POC: NEGATIVE
Crystals, Ur, HPF, POC: NEGATIVE
Epithelial cells, urine per micros: NEGATIVE
MUCUS UA: NEGATIVE
WBC, UR, HPF, POC: NEGATIVE
Yeast, UA: NEGATIVE

## 2014-09-02 NOTE — Telephone Encounter (Signed)
Pt needing second urine sample per Sr. Copland, spoke w/ Maryellen Pile who said orders would be put in, and pt could come in to the walk in center to have this done without seeing a Dr.

## 2014-09-02 NOTE — Telephone Encounter (Signed)
Per Jethro Bolus this has been addressed.

## 2014-10-09 ENCOUNTER — Other Ambulatory Visit: Payer: Self-pay | Admitting: Family Medicine

## 2014-10-10 NOTE — Telephone Encounter (Signed)
Called in.

## 2015-01-26 ENCOUNTER — Other Ambulatory Visit: Payer: Self-pay | Admitting: Family Medicine

## 2015-02-22 ENCOUNTER — Other Ambulatory Visit: Payer: Self-pay | Admitting: Physician Assistant

## 2015-07-25 ENCOUNTER — Other Ambulatory Visit: Payer: Self-pay

## 2015-07-25 DIAGNOSIS — Z1231 Encounter for screening mammogram for malignant neoplasm of breast: Secondary | ICD-10-CM

## 2015-08-08 ENCOUNTER — Ambulatory Visit (INDEPENDENT_AMBULATORY_CARE_PROVIDER_SITE_OTHER): Payer: BC Managed Care – PPO | Admitting: Family Medicine

## 2015-08-08 VITALS — BP 144/82 | HR 76 | Temp 98.2°F | Resp 18 | Ht 61.0 in | Wt 144.0 lb

## 2015-08-08 DIAGNOSIS — IMO0001 Reserved for inherently not codable concepts without codable children: Secondary | ICD-10-CM

## 2015-08-08 DIAGNOSIS — I10 Essential (primary) hypertension: Secondary | ICD-10-CM | POA: Diagnosis not present

## 2015-08-08 DIAGNOSIS — K9041 Non-celiac gluten sensitivity: Secondary | ICD-10-CM

## 2015-08-08 DIAGNOSIS — Z23 Encounter for immunization: Secondary | ICD-10-CM

## 2015-08-08 DIAGNOSIS — M609 Myositis, unspecified: Secondary | ICD-10-CM

## 2015-08-08 DIAGNOSIS — M791 Myalgia: Secondary | ICD-10-CM

## 2015-08-08 DIAGNOSIS — E8881 Metabolic syndrome: Secondary | ICD-10-CM

## 2015-08-08 DIAGNOSIS — M797 Fibromyalgia: Secondary | ICD-10-CM | POA: Diagnosis not present

## 2015-08-08 DIAGNOSIS — Z91018 Allergy to other foods: Secondary | ICD-10-CM | POA: Diagnosis not present

## 2015-08-08 LAB — POCT GLYCOSYLATED HEMOGLOBIN (HGB A1C): HEMOGLOBIN A1C: 5.6

## 2015-08-08 MED ORDER — HYDROCHLOROTHIAZIDE 25 MG PO TABS: 25.0000 mg | ORAL_TABLET | Freq: Every day | ORAL | Status: DC

## 2015-08-08 MED ORDER — AMITRIPTYLINE HCL 25 MG PO TABS: 25.0000 mg | ORAL_TABLET | Freq: Every day | ORAL | Status: DC

## 2015-08-08 MED ORDER — CYCLOBENZAPRINE HCL 5 MG PO TABS: 5.0000 mg | ORAL_TABLET | Freq: Three times a day (TID) | ORAL | Status: DC | PRN

## 2015-08-08 NOTE — Progress Notes (Signed)
Subjective:  This chart was scribed for Delman Cheadle, MD by [facility], medical scribe at [facility].The patient was seen in exam room 12 and the patient's care was started at 6:10 PM.   Patient ID: Becky Mueller, female    DOB: Jun 02, 1956, 59 y.o.   MRN: [id number] Chief Complaint  Patient presents with  . Medication Refill    HCTZ   HPI HPI Comments: Becky Mueller is a 59 y.o. female who presents to [facility] for a HCTZ refill. Last seen one year ago for a HCTZ refill. Orthostatic symptoms at that time, also has a hx of arthritis or fibromyalgia which improved on a gluten free diet, planned exercise with a goal weight of 130 pounds. Last three pounds since last year. At last visit A1C was 6.4, advised to work on a no carb diet.  HTN: Stopped taking her HCTZ. BP checked today was 165/95 at a chiropractor visit. No side effects on 12.5 HCTZ. Began developing HA roughly one month ago. No CP, heart palpations, SOB, leg swelling.   Fibromyalgia: She has not maintained her gluten free diet. Pain is bad, some days worse than others. She says pain is worse when she does strenuous work outside her typical routine. No insomnia. Symptoms do improve when off gluten. Cannot do deep tissue massages due to the pain. Interested in taking medication for fibromyalgia.   Prediabetes: Diet goals include: linking protein and carbs in meal, and limiting carb intake. Trouble has been maintaining. Youngest son freshman in college and plans to focus on herself. Mild exercise, walking 30-60 min a day with her dog.  Past Medical History  Diagnosis Date  . Cancer 2004    skin  . Hypertension   . Urolithiasis 1980s  . Arthritis   . Diverticulitis    Current Outpatient Prescriptions on File Prior to Visit  Medication Sig Dispense Refill  . hydrochlorothiazide (HYDRODIURIL) 25 MG tablet Take 0.5 tablets (12.5 mg total) by mouth daily. NO MORE REFILLS WITHOUT OFFICE VISIT -  2ND NOTICE (Patient not taking: Reported on 08/08/2015) 8 tablet 0   No current facility-administered medications on file prior to visit.   Allergies  Allergen Reactions  . Septra [Bactrim] Hives  . Tetracyclines & Related Hives   Review of Systems  Respiratory: Negative for shortness of breath.   Cardiovascular: Negative for chest pain, palpitations and leg swelling.  Musculoskeletal: Positive for myalgias.  Psychiatric/Behavioral: Negative for sleep disturbance.      Objective:  BP 144/82 mmHg  Pulse 76  Temp(Src) 98.2 F (36.8 C)  Resp 18  Ht 5\' 1"  (1.549 m)  Wt 144 lb (65.318 kg)  BMI 27.22 kg/m2  SpO2 98%  LMP 07/24/2011 Physical Exam  Constitutional: She is oriented to person, place, and time. She appears well-developed and well-nourished. No distress.  HENT:  Head: Normocephalic and atraumatic.  Eyes: Pupils are equal, round, and reactive to light.  Neck: Normal range of motion. Neck supple. No thyromegaly present.  Cardiovascular: Normal rate, regular rhythm, S1 normal, S2 normal and normal heart sounds.   No murmur heard. Pulmonary/Chest: Effort normal and breath sounds normal. No respiratory distress. She has no wheezes.  Musculoskeletal: Normal range of motion.  Lymphadenopathy:    She has no cervical adenopathy.  Neurological: She is alert and oriented to person, place, and time.  Skin: Skin is warm and dry.  Psychiatric: She has a normal mood and affect. Her behavior is normal.  Nursing note and vitals reviewed.     Results for orders placed or performed in visit on 08/08/15  POCT glycosylated hemoglobin (Hb A1C)  Result Value Ref Range   Hemoglobin A1C 5.6     Assessment & Plan:   1. Essential hypertension   2. Myalgia and myositis   3. Fibromyalgia   4. Insulin resistance   5. Non-celiac gluten sensitivity     Orders Placed This Encounter  Procedures  . Flu Vaccine QUAD 36+ mos IM  . Ambulatory referral to Physical Therapy    Referral  Priority:  Routine    Referral Type:  Physical Medicine    Referral Reason:  Specialty Services Required    Requested Specialty:  Physical Therapy    Number of Visits Requested:  1  . POCT glycosylated hemoglobin (Hb A1C)    Meds ordered this encounter  Medications  . hydrochlorothiazide (HYDRODIURIL) 25 MG tablet    Sig: Take 1 tablet (25 mg total) by mouth daily.    Dispense:  90 tablet    Refill:  1  . amitriptyline (ELAVIL) 25 MG tablet    Sig: Take 1 tablet (25 mg total) by mouth at bedtime.    Dispense:  30 tablet    Refill:  5  . cyclobenzaprine (FLEXERIL) 5 MG tablet    Sig: Take 1 tablet (5 mg total) by mouth 3 (three) times daily as needed for muscle spasms.    Dispense:  30 tablet    Refill:  1    I personally performed the services described in this documentation, which was scribed in my presence. The recorded information has been reviewed and considered, and addended by me as needed.  Delman Cheadle, MD MPH

## 2015-08-08 NOTE — Patient Instructions (Signed)
Fibromyalgia Fibromyalgia is a disorder that is often misunderstood. It is associated with muscular pains and tenderness that comes and goes. It is often associated with fatigue and sleep disturbances. Though it tends to be long-lasting, fibromyalgia is not life-threatening. CAUSES  The exact cause of fibromyalgia is unknown. People with certain gene types are predisposed to developing fibromyalgia and other conditions. Certain factors can play a role as triggers, such as:  Spine disorders.  Arthritis.  Severe injury (trauma) and other physical stressors.  Emotional stressors. SYMPTOMS   The main symptom is pain and stiffness in the muscles and joints, which can vary over time.  Sleep and fatigue problems. Other related symptoms may include:  Bowel and bladder problems.  Headaches.  Visual problems.  Problems with odors and noises.  Depression or mood changes.  Painful periods (dysmenorrhea).  Dryness of the skin or eyes. DIAGNOSIS  There are no specific tests for diagnosing fibromyalgia. Patients can be diagnosed accurately from the specific symptoms they have. The diagnosis is made by determining that nothing else is causing the problems. TREATMENT  There is no cure. Management includes medicines and an active, healthy lifestyle. The goal is to enhance physical fitness, decrease pain, and improve sleep. HOME CARE INSTRUCTIONS   Only take over-the-counter or prescription medicines as directed by your caregiver. Sleeping pills, tranquilizers, and pain medicines may make your problems worse.  Low-impact aerobic exercise is very important and advised for treatment. At first, it may seem to make pain worse. Gradually increasing your tolerance will overcome this feeling.  Learning relaxation techniques and how to control stress will help you. Biofeedback, visual imagery, hypnosis, muscle relaxation, yoga, and meditation are all options.  Anti-inflammatory medicines and  physical therapy may provide short-term help.  Acupuncture or massage treatments may help.  Take muscle relaxant medicines as suggested by your caregiver.  Avoid stressful situations.  Plan a healthy lifestyle. This includes your diet, sleep, rest, exercise, and friends.  Find and practice a hobby you enjoy.  Join a fibromyalgia support group for interaction, ideas, and sharing advice. This may be helpful. SEEK MEDICAL CARE IF:  You are not having good results or improvement from your treatment. FOR MORE INFORMATION  National Fibromyalgia Association: www.fmaware.org Arthritis Foundation: www.arthritis.org Document Released: 11/26/2005 Document Revised: 02/18/2012 Document Reviewed: 03/08/2010 ExitCare Patient Information 2015 [employer]. This information is not intended to replace advice given to you by your health care provider. Make sure you discuss any questions you have with your health care provider.  

## 2015-08-19 ENCOUNTER — Ambulatory Visit
Admission: RE | Admit: 2015-08-19 | Discharge: 2015-08-19 | Disposition: A | Discharge status: Home / Self Care | Payer: BC Managed Care – PPO | Source: Ambulatory Visit

## 2015-08-19 DIAGNOSIS — Z1231 Encounter for screening mammogram for malignant neoplasm of breast: Secondary | ICD-10-CM

## 2015-12-08 ENCOUNTER — Encounter: Payer: BC Managed Care – PPO | Admitting: Family Medicine

## 2015-12-25 ENCOUNTER — Telehealth: Payer: Self-pay | Admitting: Family Medicine

## 2015-12-25 NOTE — Telephone Encounter (Signed)
lmom to call and reschedule her appt date and time from Thursday 02-02-16 @ 8:15 to Tuesday 01-31-16 @8 :15 with shaw

## 2016-02-02 ENCOUNTER — Encounter: Payer: BC Managed Care – PPO | Admitting: Family Medicine

## 2016-05-09 ENCOUNTER — Other Ambulatory Visit: Payer: Self-pay | Admitting: Family Medicine

## 2016-07-05 ENCOUNTER — Ambulatory Visit (INDEPENDENT_AMBULATORY_CARE_PROVIDER_SITE_OTHER): Payer: BC Managed Care – PPO | Admitting: Family Medicine

## 2016-07-05 ENCOUNTER — Encounter: Payer: Self-pay | Admitting: Family Medicine

## 2016-07-05 VITALS — BP 124/76 | HR 80 | Temp 98.6°F | Resp 18 | Ht 61.0 in | Wt 146.0 lb

## 2016-07-05 DIAGNOSIS — R7309 Other abnormal glucose: Secondary | ICD-10-CM | POA: Diagnosis not present

## 2016-07-05 DIAGNOSIS — Z5181 Encounter for therapeutic drug level monitoring: Secondary | ICD-10-CM | POA: Diagnosis not present

## 2016-07-05 DIAGNOSIS — I10 Essential (primary) hypertension: Secondary | ICD-10-CM | POA: Diagnosis not present

## 2016-07-05 DIAGNOSIS — M797 Fibromyalgia: Secondary | ICD-10-CM | POA: Diagnosis not present

## 2016-07-05 LAB — COMPREHENSIVE METABOLIC PANEL WITH GFR
ALT: 13 U/L (ref 6–29)
AST: 19 U/L (ref 10–35)
Albumin: 4.1 g/dL (ref 3.6–5.1)
Alkaline Phosphatase: 99 U/L (ref 33–130)
BUN: 12 mg/dL (ref 7–25)
CO2: 25 mmol/L (ref 20–31)
Calcium: 9.4 mg/dL (ref 8.6–10.4)
Chloride: 103 mmol/L (ref 98–110)
Creat: 0.78 mg/dL (ref 0.50–1.05)
Glucose, Bld: 100 mg/dL — ABNORMAL HIGH (ref 65–99)
Potassium: 3.8 mmol/L (ref 3.5–5.3)
Sodium: 141 mmol/L (ref 135–146)
Total Bilirubin: 0.7 mg/dL (ref 0.2–1.2)
Total Protein: 7.1 g/dL (ref 6.1–8.1)

## 2016-07-05 LAB — LIPID PANEL
CHOL/HDL RATIO: 2.9 ratio (ref <=5.0)
CHOLESTEROL: 232 mg/dL — AB (ref 125–200)
HDL: 81 mg/dL (ref >=46)
LDL Cholesterol: 135 mg/dL — ABNORMAL HIGH (ref <130)
Triglycerides: 82 mg/dL (ref <150)
VLDL: 16 mg/dL (ref <30)

## 2016-07-05 LAB — HEMOGLOBIN A1C
Hgb A1c MFr Bld: 6.3 % — ABNORMAL HIGH
Mean Plasma Glucose: 134 mg/dL

## 2016-07-05 MED ORDER — HYDROCHLOROTHIAZIDE 25 MG PO TABS: 25.0000 mg | ORAL_TABLET | Freq: Every day | ORAL | 2 refills | Status: DC

## 2016-07-05 NOTE — Progress Notes (Signed)
Subjective:    Patient ID: Becky Mueller, female    DOB: [birth year], 58 y.o.   MRN: [id number] Chief Complaint  Patient presents with  . Medication Refill    HYDROCHLOROTHIAZIDE    HPI  Becky Mueller is here for a follow-up on her chronic medical conditions and medication refill. She was last seen 1 yr prior.  HTN:  On  hctz 12.5 qd.  Does not check her blood pressure and no med side effects.  Dizziness resolves. Pre-DM: 6.4-> 5.6 last year Fibromyalgia: Improved on gluten free low carb diet. Walking daily for exercise 2 30 minutes sessions a day.  Her sxs have improved a little.  She is doing yoga and regulary massage. Elavil 25 qhs caused extreme fatigue, weight gain, dry mouth and over 2-3 mos the side effects were not the benefit. Not needing flexeril.  She is doing more research and thinks her sxs maybe related to inflammation as she does not have any sleep or mood problems.  Will do experimental diet elimination.  Most days she doesn't need a nsaid but if she over-exerts like doing a lot of yardwork, the following day she will have musch more pain and then she'll take an aleve.  Her muscles overreact to the stimulus.  Take a mvi, probiotic, an extra b12 vitamin SL, vit C, fiber Gets D from the sum  Past Medical History:  Diagnosis Date  . Arthritis   . Cancer ([place]) 2004   skin  . Diverticulitis   . Hypertension   . Urolithiasis 1980s   Past Surgical History:  Procedure Laterality Date  . CESAREAN SECTION     x 2   No current outpatient prescriptions on file prior to visit.   No current facility-administered medications on file prior to visit.    Allergies  Allergen Reactions  . Septra [Bactrim] Hives  . Tetracyclines & Related Hives   Family History  Problem Relation Age of Onset  . Hypertension Mother   . COPD Mother     emphysema  . Cancer Sister 72    breast  . Arthritis Sister     RA  . Early death Paternal Uncle    Social History   Social History  .  Marital status: Married    Spouse name: N/A  . Number of children: N/A  . Years of education: N/A   Social History Main Topics  . Smoking status: Never Smoker  . Smokeless tobacco: Never Used  . Alcohol use 0.0 oz/week    2 - 3 drink(s) per week  . Drug use: No  . Sexual activity: Yes   Other Topics Concern  . None   Social History Narrative  . None     sReview of Systems  Constitutional: Negative for activity change, appetite change, chills, diaphoresis, fever and unexpected weight change.  Eyes: Negative for visual disturbance.  Respiratory: Negative for cough and shortness of breath.   Cardiovascular: Negative for chest pain, palpitations and leg swelling.  Genitourinary: Negative for decreased urine volume.  Musculoskeletal: Positive for arthralgias, back pain and myalgias. Negative for gait problem.  Neurological: Negative for dizziness, syncope, light-headedness and headaches.  Hematological: Does not bruise/bleed easily.  Psychiatric/Behavioral: Negative for dysphoric mood and sleep disturbance. The patient is not nervous/anxious.        Objective:   Physical Exam  Constitutional: She is oriented to person, place, and time. She appears well-developed and well-nourished. No distress.  HENT:  Head: Normocephalic and atraumatic.  Right  Ear: External ear normal.  Left Ear: External ear normal.  Eyes: Conjunctivae are normal. No scleral icterus.  Neck: Normal range of motion. Neck supple. No thyromegaly present.  Cardiovascular: Normal rate, regular rhythm, normal heart sounds and intact distal pulses.   Pulmonary/Chest: Effort normal and breath sounds normal. No respiratory distress.  Musculoskeletal: She exhibits no edema.  Lymphadenopathy:    She has no cervical adenopathy.  Neurological: She is alert and oriented to person, place, and time.  Skin: Skin is warm and dry. She is not diaphoretic. No erythema.  Psychiatric: She has a normal mood and affect. Her  behavior is normal.      BP 124/76   Pulse 80   Temp 98.6 F (37 C) (Oral)   Resp 18   Ht 5\' 1"  (1.549 m)   Wt 146 lb (66.2 kg)   LMP 07/24/2011   SpO2 99%   BMI 27.59 kg/m     Assessment & Plan:   Needs flp - none prior on chart Rec CPE  This yr - last was 12/2012  1. Essential hypertension - increase hctz from 15.2 to 25. increase K in diet due to hctz  2. Elevated glucose   3. Fibromyalgia - pt questioning diagnosis due to lack of emotional, mental, and sleep disturbances/fatigue, could not tol elavil or flexeril side effects, cont low gluten diet which seems to help much more than meds  4. Medication monitoring encounter     Orders Placed This Encounter  Procedures  . Comprehensive metabolic panel  . Hemoglobin A1c  . Lipid panel    Order Specific Question:   Has the patient fasted?    Answer:   Yes  . Care order/instruction:    AVS and GO after labs are drawn    Scheduling Instructions:     AVS and GO    Meds ordered this encounter  Medications  . hydrochlorothiazide (HYDRODIURIL) 25 MG tablet    Sig: Take 1 tablet (25 mg total) by mouth daily.    Dispense:  90 tablet    Refill:  2     Delman Cheadle, M.D.  [facility] [address] [place], [place] [address] [phone] phone [phone] fax  07/08/16 4:52 PM  Results for orders placed or performed in visit on 07/05/16  Comprehensive metabolic panel  Result Value Ref Range   Sodium 141 135 - 146 mmol/L   Potassium 3.8 3.5 - 5.3 mmol/L   Chloride 103 98 - 110 mmol/L   CO2 25 20 - 31 mmol/L   Glucose, Bld 100 (H) 65 - 99 mg/dL   BUN 12 7 - 25 mg/dL   Creat 0.78 0.50 - 1.05 mg/dL   Total Bilirubin 0.7 0.2 - 1.2 mg/dL   Alkaline Phosphatase 99 33 - 130 U/L   AST 19 10 - 35 U/L   ALT 13 6 - 29 U/L   Total Protein 7.1 6.1 - 8.1 g/dL   Albumin 4.1 3.6 - 5.1 g/dL   Calcium 9.4 8.6 - 10.4 mg/dL  Hemoglobin A1c  Result Value Ref Range   Hgb A1c MFr Bld 6.3 (H) <5.7  %   Mean Plasma Glucose 134 mg/dL  Lipid panel  Result Value Ref Range   Cholesterol 232 (H) 125 - 200 mg/dL   Triglycerides 82 <150 mg/dL   HDL 81 >=46 mg/dL   Total CHOL/HDL Ratio 2.9 <=5.0 Ratio   VLDL 16 <30 mg/dL   LDL Cholesterol  135 (H) <130 mg/dL

## 2016-07-05 NOTE — Patient Instructions (Addendum)
IF you received an x-ray today, you will receive an invoice from [facility]. Please contact [facility] at [phone] with questions or concerns regarding your invoice.   IF you received labwork today, you will receive an invoice from [employer]. Please contact [facility] at [phone] with questions or concerns regarding your invoice.   Our billing staff will not be able to assist you with questions regarding bills from these companies.  You will be contacted with the lab results as soon as they are available. The fastest way to get your results is to activate your My Chart account. Instructions are located on the last page of this paperwork. If you have not heard from [place] regarding the results in 2 weeks, please contact this office.    Low-Gluten Eating Plan Gluten is a protein that is found in wheat, barley, rye, and some other grains. Some people have a condition that makes them unable to digest gluten. For those people, eating just a small amount of gluten can damage their intestines. This is not a gluten-free eating plan. This low-gluten eating plan is for people who feel better when they eat less gluten. WHAT DO I NEED TO KNOW ABOUT THIS EATING PLAN?  You can eat anything that does not contain wheat or other grains that have gluten.  You can eat anything that is labeled "gluten-free."  Make sure to read food labels.  Eat a variety of foods so you get all of the nutrients that you need.  Avoid processed foods and sauces because many of them contain wheat. To have more control over the ingredients in your meals, consider making food yourself instead of buying prepared foods. WHAT FOODS CAN I EAT? Grains Rice. Bulgur. Quinoa. Corn. Buckwheat. Amaranth. Corn tortillas or taco shells. Oatmeal that is labeled as "gluten free" or "uncontaminated." Vegetables Lettuce. Spinach. Peas. Beets. Cauliflower. Cabbage. Broccoli. Carrots.  Tomatoes. Squash. Eggplant. Herbs. Peppers. Onions. Cucumbers. Brussels sprouts. Yams and sweet potatoes. Beans. Lentils. Fruits Bananas. Apples. Oranges. Grapes. Papaya. Mango. Pomegranate. Kiwi. Grapefruit. Cherries. Meats and Other Protein Sources Beef. Pork. Chicken. [place]. Fish. Eggs. Tofu. Beans. Nuts. Lentils. Dairy Milk. Ice cream. Yogurt. Cheese. Cottage cheese. Beverages Water. Coffee. Tea. Juice. Soda. Seltzer water. Condiments Mustard. Relish. Low-fat, low-sugar ketchup. Low-fat, low-sugar barbecue sauce. Vinegar. Low-fat or fat-free mayonnaise. Sweets and Desserts Honey. Sugar. Maple syrup. Fats and Oils Butter. Vegetable oil. Olive oil. Canola oil. [place] oil. Other [place] or cornstarch. Potato flour. The items listed above may not be a complete list of recommended foods or beverages. Contact your dietitian for more options. WHAT FOODS ARE NOT RECOMMENDED? Grains Wheat. Barley. Rye. Oatmeal. Meat and Other Protein Sources Seitan. Cold cuts. Hotdogs. Salami. Sausages. Beverages Beer. Condiments Malt vinegar. Salad dressing. Soy sauce. Sweets and Desserts Licorice. Brown rice syrup. Pre-made pudding or pudding mixes. Other Bouillon cubes. Canned or boxed pre-made soups or soup packets. Bagged chips, such as potato chips and tortilla chips. Seasoning packets. The items listed above may not be a complete list of foods and beverages to avoid. Contact your dietitian for more information.   This information is not intended to replace advice given to you by your health care provider. Make sure you discuss any questions you have with your health care provider.   Document Released: 04/12/2015 Document Reviewed: 04/12/2015 Elsevier Interactive Patient Education 2016 [place]. Potassium Content of Foods Potassium is a mineral found in many foods and drinks. It helps keep fluids and minerals balanced in  your body and affects how steadily your heart beats. Potassium  also helps control your blood pressure and keep your muscles and nervous system healthy. Certain health conditions and medicines may change the balance of potassium in your body. When this happens, you can help balance your level of potassium through the foods that you do or do not eat. Your health care provider or dietitian may recommend an amount of potassium that you should have each day. The following lists of foods provide the amount of potassium (in parentheses) per serving in each item. HIGH IN POTASSIUM  The following foods and beverages have 200 mg or more of potassium per serving:  Apricots, 2 raw or 5 dry (200 mg).  Artichoke, 1 medium (345 mg).  Avocado, raw,  each (245 mg).  Banana, 1 medium (425 mg).  Beans, lima, or baked beans, canned,  cup (280 mg).  Beans, white, canned,  cup (595 mg).  Beef roast, 3 oz (320 mg).  Beef, ground, 3 oz (270 mg).  Beets, raw or cooked,  cup (260 mg).  Bran muffin, 2 oz (300 mg).  Broccoli,  cup (230 mg).  Brussels sprouts,  cup (250 mg).  Cantaloupe,  cup (215 mg).  Cereal, 100% bran,  cup (200-400 mg).  Cheeseburger, single, fast food, 1 each (225-400 mg).  Chicken, 3 oz (220 mg).  Clams, canned, 3 oz (535 mg).  Crab, 3 oz (225 mg).  Dates, 5 each (270 mg).  Dried beans and peas,  cup (300-475 mg).  Figs, dried, 2 each (260 mg).  Fish: halibut, tuna, cod, snapper, 3 oz (480 mg).  Fish: salmon, haddock, swordfish, perch, 3 oz (300 mg).  Fish, tuna, canned 3 oz (200 mg).  [place] fries, fast food, 3 oz (470 mg).  Granola with fruit and nuts,  cup (200 mg).  Grapefruit juice,  cup (200 mg).  Greens, beet,  cup (655 mg).  Honeydew melon,  cup (200 mg).  Kale, raw, 1 cup (300 mg).  Kiwi, 1 medium (240 mg).  Kohlrabi, rutabaga, parsnips,  cup (280 mg).  Lentils,  cup (365 mg).  Mango, 1 each (325 mg).  Milk, chocolate, 1 cup (420 mg).  Milk: nonfat, low-fat, whole, buttermilk, 1 cup  (350-380 mg).  Molasses, 1 Tbsp (295 mg).  Mushrooms,  cup (280) mg.  Nectarine, 1 each (275 mg).  Nuts: almonds, peanuts, hazelnuts, [place], cashew, mixed, 1 oz (200 mg).  Nuts, pistachios, 1 oz (295 mg).  Orange, 1 each (240 mg).  Orange juice,  cup (235 mg).  Papaya, medium,  fruit (390 mg).  Peanut butter, chunky, 2 Tbsp (240 mg).  Peanut butter, smooth, 2 Tbsp (210 mg).  Pear, 1 medium (200 mg).  Pomegranate, 1 whole (400 mg).  Pomegranate juice,  cup (215 mg).  Pork, 3 oz (350 mg).  Potato chips, salted, 1 oz (465 mg).  Potato, baked with skin, 1 medium (925 mg).  Potatoes, boiled,  cup (255 mg).  Potatoes, mashed,  cup (330 mg).  Prune juice,  cup (370 mg).  Prunes, 5 each (305 mg).  Pudding, chocolate,  cup (230 mg).  Pumpkin, canned,  cup (250 mg).  Raisins, seedless,  cup (270 mg).  Seeds, sunflower or pumpkin, 1 oz (240 mg).  Soy milk, 1 cup (300 mg).  Spinach,  cup (420 mg).  Spinach, canned,  cup (370 mg).  Sweet potato, baked with skin, 1 medium (450 mg).  Swiss chard,  cup (480 mg).  Tomato or vegetable juice,  cup (275 mg).  Tomato sauce or puree,  cup (400-550 mg).  Tomato, raw, 1 medium (290 mg).  Tomatoes, canned,  cup (200-300 mg).  [place], 3 oz (250 mg).  Wheat germ, 1 oz (250 mg).  Winter squash,  cup (250 mg).  Yogurt, plain or fruited, 6 oz (260-435 mg).  Zucchini,  cup (220 mg). MODERATE IN POTASSIUM The following foods and beverages have 50-200 mg of potassium per serving:  Apple, 1 each (150 mg).  Apple juice,  cup (150 mg).  Applesauce,  cup (90 mg).  Apricot nectar,  cup (140 mg).  Asparagus, small spears,  cup or 6 spears (155 mg).  Bagel, cinnamon raisin, 1 each (130 mg).  Bagel, egg or plain, 4 in., 1 each (70 mg).  Beans, green,  cup (90 mg).  Beans, yellow,  cup (190 mg).  Beer, regular, 12 oz (100 mg).  Beets, canned,  cup (125 mg).  Blackberries,  cup  (115 mg).  Blueberries,  cup (60 mg).  Bread, whole wheat, 1 slice (70 mg).  Broccoli, raw,  cup (145 mg).  Cabbage,  cup (150 mg).  Carrots, cooked or raw,  cup (180 mg).  Cauliflower, raw,  cup (150 mg).  Celery, raw,  cup (155 mg).  Cereal, bran flakes, cup (120-150 mg).  Cheese, cottage,  cup (110 mg).  Cherries, 10 each (150 mg).  Chocolate, 1 oz bar (165 mg).  Coffee, brewed 6 oz (90 mg).  Corn,  cup or 1 ear (195 mg).  Cucumbers,  cup (80 mg).  Egg, large, 1 each (60 mg).  Eggplant,  cup (60 mg).  Endive, raw, cup (80 mg).  English muffin, 1 each (65 mg).  Fish, orange roughy, 3 oz (150 mg).  Frankfurter, beef or pork, 1 each (75 mg).  Fruit cocktail,  cup (115 mg).  Grape juice,  cup (170 mg).  Grapefruit,  fruit (175 mg).  Grapes,  cup (155 mg).  Greens: kale, turnip, collard,  cup (110-150 mg).  Ice cream or frozen yogurt, chocolate,  cup (175 mg).  Ice cream or frozen yogurt, vanilla,  cup (120-150 mg).  Lemons, limes, 1 each (80 mg).  Lettuce, all types, 1 cup (100 mg).  Mixed vegetables,  cup (150 mg).  Mushrooms, raw,  cup (110 mg).  Nuts: walnuts, pecans, or macadamia, 1 oz (125 mg).  Oatmeal,  cup (80 mg).  Okra,  cup (110 mg).  Onions, raw,  cup (120 mg).  Peach, 1 each (185 mg).  Peaches, canned,  cup (120 mg).  Pears, canned,  cup (120 mg).  Peas, green, frozen,  cup (90 mg).  Peppers, green,  cup (130 mg).  Peppers, red,  cup (160 mg).  Pineapple juice,  cup (165 mg).  Pineapple, fresh or canned,  cup (100 mg).  Plums, 1 each (105 mg).  Pudding, vanilla,  cup (150 mg).  Raspberries,  cup (90 mg).  Rhubarb,  cup (115 mg).  Rice, wild,  cup (80 mg).  Shrimp, 3 oz (155 mg).  Spinach, raw, 1 cup (170 mg).  Strawberries,  cup (125 mg).  Summer squash  cup (175-200 mg).  Swiss chard, raw, 1 cup (135 mg).  Tangerines, 1 each (140 mg).  Tea, brewed, 6 oz (65  mg).  Turnips,  cup (140 mg).  Watermelon,  cup (85 mg).  Wine, red, table, 5 oz (180 mg).  Wine, white, table, 5 oz (100 mg). LOW IN POTASSIUM The following foods and beverages have less  than 50 mg of potassium per serving.  Bread, white, 1 slice (30 mg).  Carbonated beverages, 12 oz (less than 5 mg).  Cheese, 1 oz (20-30 mg).  Cranberries,  cup (45 mg).  Cranberry juice cocktail,  cup (20 mg).  Fats and oils, 1 Tbsp (less than 5 mg).  Hummus, 1 Tbsp (32 mg).  Nectar: papaya, mango, or pear,  cup (35 mg).  Rice, white or brown,  cup (50 mg).  Spaghetti or macaroni,  cup cooked (30 mg).  Tortilla, flour or corn, 1 each (50 mg).  Waffle, 4 in., 1 each (50 mg).  Water chestnuts,  cup (40 mg).   This information is not intended to replace advice given to you by your health care provider. Make sure you discuss any questions you have with your health care provider.   Document Released: 07/10/2005 Document Revised: 12/01/2013 Document Reviewed: 10/23/2013 Elsevier Interactive Patient Education [employer].

## 2016-12-08 ENCOUNTER — Ambulatory Visit (INDEPENDENT_AMBULATORY_CARE_PROVIDER_SITE_OTHER): Payer: BC Managed Care – PPO | Admitting: Family Medicine

## 2016-12-08 ENCOUNTER — Encounter: Payer: Self-pay | Admitting: Family Medicine

## 2016-12-08 VITALS — BP 122/72 | HR 99 | Temp 98.8°F | Resp 17 | Ht 62.0 in | Wt 145.0 lb

## 2016-12-08 DIAGNOSIS — R7303 Prediabetes: Secondary | ICD-10-CM | POA: Diagnosis not present

## 2016-12-08 DIAGNOSIS — J019 Acute sinusitis, unspecified: Secondary | ICD-10-CM

## 2016-12-08 DIAGNOSIS — E079 Disorder of thyroid, unspecified: Secondary | ICD-10-CM | POA: Diagnosis not present

## 2016-12-08 LAB — POCT CBC
Granulocyte percent: 66.6 %G (ref 37–80)
HEMATOCRIT: 38.3 % (ref 37.7–47.9)
HEMOGLOBIN: 13.6 g/dL (ref 12.2–16.2)
LYMPH, POC: 2 (ref 0.6–3.4)
MCH, POC: 31.8 pg — AB (ref 27–31.2)
MCHC: 35.5 g/dL — AB (ref 31.8–35.4)
MCV: 89.7 fL (ref 80–97)
MID (cbc): 0.6 (ref 0–0.9)
MPV: 6.5 fL (ref 0–99.8)
POC GRANULOCYTE: 5.1 (ref 2–6.9)
POC LYMPH %: 26 % (ref 10–50)
POC MID %: 7.4 %M (ref 0–12)
Platelet Count, POC: 268 10*3/uL (ref 142–424)
RBC: 4.27 M/uL (ref 4.04–5.48)
RDW, POC: 12 %
WBC: 7.7 10*3/uL (ref 4.6–10.2)

## 2016-12-08 MED ORDER — FLUTICASONE PROPIONATE 50 MCG/ACT NA SUSP: 2.0000 | Freq: Every day | NASAL | 2 refills | Status: DC

## 2016-12-08 MED ORDER — AMOXICILLIN 875 MG PO TABS: 875.0000 mg | ORAL_TABLET | Freq: Two times a day (BID) | ORAL | 0 refills | Status: DC

## 2016-12-08 NOTE — Patient Instructions (Addendum)
   IF you received an x-ray today, you will receive an invoice from [facility]. Please contact  Radiology at [phone] with questions or concerns regarding your invoice.   IF you received labwork today, you will receive an invoice from [facility]. Please contact [facility] at [phone] with questions or concerns regarding your invoice.   Our billing staff will not be able to assist you with questions regarding bills from these companies.  You will be contacted with the lab results as soon as they are available. The fastest way to get your results is to activate your My Chart account. Instructions are located on the last page of this paperwork. If you have not heard from us regarding the results in 2 weeks, please contact this office.      Sinusitis, Adult Sinusitis is soreness and inflammation of your sinuses. Sinuses are hollow spaces in the bones around your face. Your sinuses are located:  Around your eyes.  In the middle of your forehead.  Behind your nose.  In your cheekbones. Your sinuses and nasal passages are lined with a stringy fluid (mucus). Mucus normally drains out of your sinuses. When your nasal tissues become inflamed or swollen, the mucus can become trapped or blocked so air cannot flow through your sinuses. This allows bacteria, viruses, and funguses to grow, which leads to infection. Sinusitis can develop quickly and last for 7?10 days (acute) or for more than 12 weeks (chronic). Sinusitis often develops after a cold. What are the causes? This condition is caused by anything that creates swelling in the sinuses or stops mucus from draining, including:  Allergies.  Asthma.  Bacterial or viral infection.  Abnormally shaped bones between the nasal passages.  Nasal growths that contain mucus (nasal polyps).  Narrow sinus openings.  Pollutants, such as chemicals or irritants in the air.  A foreign object stuck in the nose.  A fungal  infection. This is rare. What increases the risk? The following factors may make you more likely to develop this condition:  Having allergies or asthma.  Having had a recent cold or respiratory tract infection.  Having structural deformities or blockages in your nose or sinuses.  Having a weak immune system.  Doing a lot of swimming or diving.  Overusing nasal sprays.  Smoking. What are the signs or symptoms? The main symptoms of this condition are pain and a feeling of pressure around the affected sinuses. Other symptoms include:  Upper toothache.  Earache.  Headache.  Bad breath.  Decreased sense of smell and taste.  A cough that may get worse at night.  Fatigue.  Fever.  Thick drainage from your nose. The drainage is often green and it may contain pus (purulent).  Stuffy nose or congestion.  Postnasal drip. This is when extra mucus collects in the throat or back of the nose.  Swelling and warmth over the affected sinuses.  Sore throat.  Sensitivity to light. How is this diagnosed? This condition is diagnosed based on symptoms, a medical history, and a physical exam. To find out if your condition is acute or chronic, your health care provider may:  Look in your nose for signs of nasal polyps.  Tap over the affected sinus to check for signs of infection.  View the inside of your sinuses using an imaging device that has a light attached (endoscope). If your health care provider suspects that you have chronic sinusitis, you may also:  Be tested for allergies.  Have a sample of   mucus taken from your nose (nasal culture) and checked for bacteria.  Have a mucus sample examined to see if your sinusitis is related to an allergy. If your sinusitis does not respond to treatment and it lasts longer than 8 weeks, you may have an MRI or CT scan to check your sinuses. These scans also help to determine how severe your infection is. In rare cases, a bone biopsy may  be done to rule out more serious types of fungal sinus disease. How is this treated? Treatment for sinusitis depends on the cause and whether your condition is chronic or acute. If a virus is causing your sinusitis, your symptoms will go away on their own within 10 days. You may be given medicines to relieve your symptoms, including:  Topical nasal decongestants. They shrink swollen nasal passages and let mucus drain from your sinuses.  Antihistamines. These drugs block inflammation that is triggered by allergies. This can help to ease swelling in your nose and sinuses.  Topical nasal corticosteroids. These are nasal sprays that ease inflammation and swelling in your nose and sinuses.  Nasal saline washes. These rinses can help to get rid of thick mucus in your nose. If your condition is caused by bacteria, you will be given an antibiotic medicine. If your condition is caused by a fungus, you will be given an antifungal medicine. Surgery may be needed to correct underlying conditions, such as narrow nasal passages. Surgery may also be needed to remove polyps. Follow these instructions at home: Medicines  Take, use, or apply over-the-counter and prescription medicines only as told by your health care provider. These may include nasal sprays.  If you were prescribed an antibiotic medicine, take it as told by your health care provider. Do not stop taking the antibiotic even if you start to feel better. Hydrate and Humidify  Drink enough water to keep your urine clear or pale yellow. Staying hydrated will help to thin your mucus.  Use a cool mist humidifier to keep the humidity level in your home above 50%.  Inhale steam for 10-15 minutes, 3-4 times a day or as told by your health care provider. You can do this in the bathroom while a hot shower is running.  Limit your exposure to cool or dry air. Rest  Rest as much as possible.  Sleep with your head raised (elevated).  Make sure to get  enough sleep each night. General instructions  Apply a warm, moist washcloth to your face 3-4 times a day or as told by your health care provider. This will help with discomfort.  Wash your hands often with soap and water to reduce your exposure to viruses and other germs. If soap and water are not available, use hand sanitizer.  Do not smoke. Avoid being around people who are smoking (secondhand smoke).  Keep all follow-up visits as told by your health care provider. This is important. Contact a health care provider if:  You have a fever.  Your symptoms get worse.  Your symptoms do not improve within 10 days. Get help right away if:  You have a severe headache.  You have persistent vomiting.  You have pain or swelling around your face or eyes.  You have vision problems.  You develop confusion.  Your neck is stiff.  You have trouble breathing. This information is not intended to replace advice given to you by your health care provider. Make sure you discuss any questions you have with your health care provider.  Document Released: 11/26/2005 Document Revised: 07/22/2016 Document Reviewed: 09/21/2015 Elsevier Interactive Patient Education  2017 [employer]  Thyroid Nodule A thyroid nodule is an isolatedgrowth of thyroid cells that forms a lump in your thyroid gland. The thyroid gland is a butterfly-shaped gland. It is found in the lower front of your neck. This gland sends chemical messengers (hormones) through your blood to all parts of your body. These hormones are important in regulating your body temperature and helping your body to use energy. Thyroid nodules are common. Most are not cancerous (are benign). You may have one nodule or several nodules. Different types of thyroid nodules include:  Nodules that grow and fill with fluid (thyroid cysts).  Nodules that produce too much thyroid hormone (hot nodules or hyperthyroid).  Nodules that produce no thyroid hormone  (cold nodules or hypothyroid).  Nodules that form from cancer cells (thyroid cancers). What are the causes? Usually, the cause of this condition is not known. What increases the risk? Factors that make this condition more likely to develop include:  Increasing age. Thyroid nodules become more common in people who are older than 58 years of age.  Gender.  Benign thyroid nodules are more common in women.  Cancerous (malignant) thyroid nodules are more common in men.  A family history that includes:  Thyroid nodules.  Pheochromocytoma.  Thyroid carcinoma.  Hyperparathyroidism.  Certain kinds of thyroid diseases, such as Hashimoto thyroiditis.  Lack of iodine.  A history of head and neck radiation, such as from X-rays. What are the signs or symptoms? It is common for this condition to cause no symptoms. If you have symptoms, they may include:  A lump in your lower neck.  Feeling a lump or tickle in your throat.  Pain in your neck, jaw, or ear.  Having trouble swallowing. Hot nodules may cause symptoms that include:  Weight loss.  Warm, flushed skin.  Feeling hot.  Feeling nervous.  A racing heartbeat. Cold nodules may cause symptoms that include:  Weight gain.  Dry skin.  Brittle hair. This may also occur with hair loss.  Feeling cold.  Fatigue. Thyroid cancer nodules may cause symptoms that include:  Hard nodules that feel stuck to the thyroid gland.  Hoarseness.  Lumps in the glands near your thyroid (lymph nodes). How is this diagnosed? A thyroid nodule may be felt by your health care provider during a physical exam. This condition may also be diagnosed based on your symptoms. You may also have tests, including:  An ultrasound. This may be done to confirm the diagnosis.  A biopsy. This involves taking a sample from the nodule and looking at it under a microscope to see if the nodule is benign.  Blood tests to make sure that your thyroid is  working properly.  Imaging tests such as MRI or CT scan may be done if:  Your nodule is large.  Your nodule is blocking your airway.  Cancer is suspected. How is this treated? Treatment depends on the cause and size of your nodule or nodules. If the nodule is benign, treatment may not be necessary. Your health care provider may monitor the nodule to see if it goes away without treatment. If the nodule continues to grow, is cancerous, or does not go away:  It may need to be drained with a needle.  It may need to be removed with surgery. If you have surgery, part or all of your thyroid gland may need to be removed as well. Follow these  instructions at home:  Pay attention to any changes in your nodule.  Take over-the-counter and prescription medicines only as told by your health care provider.  Keep all follow-up visits as told by your health care provider. This is important. Contact a health care provider if:  Your voice changes.  You have trouble swallowing.  You have pain in your neck, ear, or jaw that is getting worse.  Your nodule gets bigger.  Your nodule starts to make it harder for you to breathe. Get help right away if:  You have a sudden fever.  You feel very weak.  Your muscles look like they are shrinking (muscle wasting).  You have mood swings.  You feel very restless.  You feel confused.  You are seeing or hearing things that other people do not see or hear (having hallucinations).  You feel suddenly nauseous or throw up.  You suddenly have diarrhea.  You have chest pain.  There is a loss of consciousness. This information is not intended to replace advice given to you by your health care provider. Make sure you discuss any questions you have with your health care provider. Document Released: 10/19/2004 Document Revised: 07/29/2016 Document Reviewed: 03/09/2015 Elsevier Interactive Patient Education  2017 [employer].

## 2016-12-08 NOTE — Progress Notes (Addendum)
Subjective:  By signing my name below, I, Moises Blood, attest that this documentation has been prepared under the direction and in the presence of Delman Cheadle, MD. Electronically Signed: Moises Blood, [place]. 12/08/2016 , 11:58 AM .  Patient was seen in Room 8 .   Patient ID: Becky Mueller, female    DOB: Jul 03, 1956, 60 y.o.   MRN: [id number] Chief Complaint  Patient presents with  . Sinusitis  . URI   HPI Becky Mueller is a 60 y.o. female who presents to [facility] complaining of sinus congestion and URI symptoms ongoing for about 2 weeks. Patient states her symptoms started with some chest congestion, cough and sore throat around 12/18 (12 days ago). She thought she it was just the cold, but her symptoms progressed into sinus congestion. She had clear mucus rhinorrhea for a few days.   After 2 weeks, she's been having increased yellowish-green drainage with some blood. She measured fever (Tmax 99.5) last night, which is high for her because her normal temperature is lower. She's also had decreased sense of taste and smell for 3~4 days but has been slowly returning. She denies any problems with sleep, although somewhat restless.   Her TSH in 2014 and 2015 were both normal.   She has started yoga 3x/wk along with resistance training and walking an hour a day.  She has been doing lower carbs, no hypoglycemic episodes, rare sodas.  She is working on true lifestyle change rather than completely restricting.    Past Medical History:  Diagnosis Date  . Arthritis   . Cancer ([place]) 2004   skin  . Diverticulitis   . Hypertension   . Urolithiasis 1980s   Prior to Admission medications   Medication Sig Start Date End Date Taking? Authorizing Provider  hydrochlorothiazide (HYDRODIURIL) 25 MG tablet Take 1 tablet (25 mg total) by mouth daily. 07/05/16  Yes Shawnee Knapp, MD   Allergies  Allergen Reactions  . Septra [Bactrim] Hives  . Tetracyclines & Related Hives   Review of Systems  Constitutional:  Positive for fever. Negative for chills, fatigue and unexpected weight change.  HENT: Positive for congestion, rhinorrhea, sinus pressure and sore throat. Negative for facial swelling and sinus pain.   Respiratory: Positive for cough. Negative for shortness of breath and wheezing.   Gastrointestinal: Negative for constipation, diarrhea, nausea and vomiting.  Skin: Negative for rash and wound.  Neurological: Negative for dizziness, weakness and headaches.  Psychiatric/Behavioral: Negative for sleep disturbance.       Objective:   Physical Exam  Constitutional: She is oriented to person, place, and time. She appears well-developed and well-nourished. No distress.  HENT:  Head: Normocephalic and atraumatic.  Right Ear: Tympanic membrane normal.  Left Ear: Tympanic membrane normal.  Nose: Nose normal. Right sinus exhibits no maxillary sinus tenderness and no frontal sinus tenderness. Left sinus exhibits no maxillary sinus tenderness and no frontal sinus tenderness.  Mouth/Throat: Oropharynx is clear and moist. No posterior oropharyngeal edema or posterior oropharyngeal erythema.  Eyes: EOM are normal. Pupils are equal, round, and reactive to light.  Neck: Neck supple. Thyromegaly (enlarged right lobe) present.  Cardiovascular: Normal rate, regular rhythm, S1 normal, S2 normal and normal heart sounds.   No murmur heard. Pulmonary/Chest: Effort normal. No respiratory distress. She has no decreased breath sounds.  Musculoskeletal: Normal range of motion.  Neurological: She is alert and oriented to person, place, and time.  Skin: Skin is warm and dry.  Psychiatric: She has a normal mood  and affect. Her behavior is normal.  Nursing note and vitals reviewed.   BP 122/72 (BP Location: Right Arm, Patient Position: Sitting, Cuff Size: Normal)   Pulse 99   Temp 98.8 F (37.1 C) (Oral)   Resp 17   Ht 5\' 2"  (1.575 m)   Wt 145 lb (65.8 kg)   LMP 07/24/2011   SpO2 98%   BMI 26.52 kg/m     Results for orders placed or performed in visit on 12/08/16  POCT CBC  Result Value Ref Range   WBC 7.7 4.6 - 10.2 K/uL   Lymph, poc 2.0 0.6 - 3.4   POC LYMPH PERCENT 26.0 10 - 50 %L   MID (cbc) 0.6 0 - 0.9   POC MID % 7.4 0 - 12 %M   POC Granulocyte 5.1 2 - 6.9   Granulocyte percent 66.6 37 - 80 %G   RBC 4.27 4.04 - 5.48 M/uL   Hemoglobin 13.6 12.2 - 16.2 g/dL   HCT, POC 38.3 37.7 - 47.9 %   MCV 89.7 80 - 97 fL   MCH, POC 31.8 (A) 27 - 31.2 pg   MCHC 35.5 (A) 31.8 - 35.4 g/dL   RDW, POC 12.0 %   Platelet Count, POC 268 142 - 424 K/uL   MPV 6.5 0 - 99.8 fL       Assessment & Plan:   1. Acute non-recurrent sinusitis, unspecified location   2. Thyroid mass - On exam today I note a very enlarged firm right thyroid lobe mass that was not present at pt's last visit with me 5 mos ago so need to proceed with thyroid US and recheck labs.  3. Prediabetes - Patient has been doing excellent work on diet and exercise changes by decreasing carbs in her diet and increasing both aerobic and weightlifting exercise. Although her weight is unchanged clearly her muscle mass is greatly improved. A1c has improved to 5.8 today from 6.3 5 months prior.    Orders Placed This Encounter  Procedures  . US THYROID    Standing Status:   Future    Standing Expiration Date:   02/06/2018    Order Specific Question:   Reason for Exam (SYMPTOM  OR DIAGNOSIS REQUIRED)    Answer:   right lobe enlarged and tender    Order Specific Question:   Preferred imaging location?    Answer:   [facility]  . Thyroid Panel With TSH  . Hemoglobin A1c  . POCT CBC    Meds ordered this encounter  Medications  . amoxicillin (AMOXIL) 875 MG tablet    Sig: Take 1 tablet (875 mg total) by mouth 2 (two) times daily.    Dispense:  14 tablet    Refill:  0  . fluticasone (FLONASE) 50 MCG/ACT nasal spray    Sig: Place 2 sprays into both nostrils at bedtime.    Dispense:  16 g    Refill:  2    I personally  performed the services described in this documentation, which was scribed in my presence. The recorded information has been reviewed and considered, and addended by me as needed.   Delman Cheadle, M.D.  [facility] [address] [place], [place] [address] [phone] phone [phone] fax  12/09/16 4:15 PM

## 2016-12-09 LAB — THYROID PANEL WITH TSH
FREE THYROXINE INDEX: 1.5 (ref 1.2–4.9)
T3 Uptake Ratio: 27 % (ref 24–39)
T4, Total: 5.7 ug/dL (ref 4.5–12.0)
TSH: 0.567 u[IU]/mL (ref 0.450–4.500)

## 2016-12-09 LAB — HEMOGLOBIN A1C
Est. average glucose Bld gHb Est-mCnc: 120 mg/dL
HEMOGLOBIN A1C: 5.8 % — AB (ref 4.8–5.6)

## 2016-12-13 ENCOUNTER — Ambulatory Visit
Admission: RE | Admit: 2016-12-13 | Discharge: 2016-12-13 | Disposition: A | Discharge status: Home / Self Care | Payer: BC Managed Care – PPO | Source: Ambulatory Visit | Attending: Family Medicine | Admitting: Family Medicine

## 2016-12-13 DIAGNOSIS — E079 Disorder of thyroid, unspecified: Secondary | ICD-10-CM

## 2017-08-10 ENCOUNTER — Other Ambulatory Visit: Payer: Self-pay | Admitting: Family Medicine

## 2017-09-04 ENCOUNTER — Ambulatory Visit (INDEPENDENT_AMBULATORY_CARE_PROVIDER_SITE_OTHER): Payer: BC Managed Care – PPO | Admitting: Family Medicine

## 2017-09-04 ENCOUNTER — Encounter: Payer: Self-pay | Admitting: Family Medicine

## 2017-09-04 VITALS — BP 134/80 | HR 88 | Temp 98.0°F | Resp 17 | Ht 61.5 in | Wt 147.0 lb

## 2017-09-04 DIAGNOSIS — Z0189 Encounter for other specified special examinations: Secondary | ICD-10-CM | POA: Diagnosis not present

## 2017-09-04 DIAGNOSIS — M542 Cervicalgia: Secondary | ICD-10-CM

## 2017-09-04 DIAGNOSIS — Z23 Encounter for immunization: Secondary | ICD-10-CM

## 2017-09-04 DIAGNOSIS — E0789 Other specified disorders of thyroid: Secondary | ICD-10-CM | POA: Diagnosis not present

## 2017-09-04 DIAGNOSIS — I10 Essential (primary) hypertension: Secondary | ICD-10-CM | POA: Diagnosis not present

## 2017-09-04 LAB — POCT CBC
GRANULOCYTE PERCENT: 57.7 % (ref 37–80)
HEMATOCRIT: 40.5 % (ref 37.7–47.9)
HEMOGLOBIN: 13.8 g/dL (ref 12.2–16.2)
Lymph, poc: 2.4 (ref 0.6–3.4)
MCH: 31.2 pg (ref 27–31.2)
MCHC: 34.1 g/dL (ref 31.8–35.4)
MCV: 91.6 fL (ref 80–97)
MID (cbc): 0.2 (ref 0–0.9)
MPV: 6.5 fL (ref 0–99.8)
POC GRANULOCYTE: 3.5 (ref 2–6.9)
POC LYMPH PERCENT: 39.2 %L (ref 10–50)
POC MID %: 3.1 % (ref 0–12)
Platelet Count, POC: 253 10*3/uL (ref 142–424)
RBC: 4.42 M/uL (ref 4.04–5.48)
RDW, POC: 12.6 %
WBC: 6 10*3/uL (ref 4.6–10.2)

## 2017-09-04 LAB — POCT SEDIMENTATION RATE: POCT SED RATE: 17 mm/hr (ref 0–22)

## 2017-09-04 MED ORDER — HYDROCHLOROTHIAZIDE 25 MG PO TABS: 25.0000 mg | ORAL_TABLET | Freq: Every day | ORAL | 3 refills | Status: DC

## 2017-09-04 NOTE — Patient Instructions (Addendum)
Thyroid cancer is not painful so unlikely to be that.    IF you received an x-ray today, you will receive an invoice from [facility]. Please contact [facility] at [phone] with questions or concerns regarding your invoice.   IF you received labwork today, you will receive an invoice from [facility]. Please contact [facility] at [phone] with questions or concerns regarding your invoice.   Our billing staff will not be able to assist you with questions regarding bills from these companies.  You will be contacted with the lab results as soon as they are available. The fastest way to get your results is to activate your My Chart account. Instructions are located on the last page of this paperwork. If you have not heard from [place] regarding the results in 2 weeks, please contact this office.      Thyroid Cancer The thyroid is a butterfly-shaped gland in the neck. The thyroid makes hormones that control functions like heart rate, blood pressure, temperature, and weight. Thyroid cancer is an abnormal growth of cells in the thyroid. These cells grow and multiply rapidly and do not die as normal cells do. There are four main types of thyroid cancer:  Papillary cancer. This is the least harmful type of thyroid cancer. It typically affects women of child-bearing age.  Follicular cancer. This type of cancer is the most likely to come back after treatment and spread to other parts of the body.  Medullary cancer. Medullary cancer can be passed down through families. A person who inherits the gene for this cancer is at high risk for developing the cancer.  Anaplastic cancer. This type of thyroid cancer spreads quickly to the windpipe (trachea) and causes breathing problems. This type of cancer is most common in people 89 years of age or older.  What are the causes? The cause of thyroid cancer is not known. What increases the risk? Risk factors for thyroid cancer  include:  Radiation exposure or radiation treatments to your head and neck during infancy or childhood.  Having an enlarged thyroid.  Having a family history of thyroid disease or inheriting family conditions.  Being female.  Being Asian.  What are the signs or symptoms? Symptoms of thyroid cancer may include:  Larger-than-normal thyroid gland.  Lumps or swelling in your neck.  Hoarseness or a change in your voice.  A cough.  Coughing up blood.  Difficulty swallowing.  Shortness of breath.  How is this diagnosed? Your health care provider will examine your thyroid during a physical exam. He or she may order tests such as:  An imaging study using sound waves and a computer (ultrasound).  Blood tests.  A tissue sample test (biopsy).  Other tests may be ordered to see if your cancer has spread. These tests may include CT, MRI, or PET scans. Your health care provider may see if you have any genetic changes that may put you at risk for other types of cancer. How is this treated? Most thyroid cancers are treated with a surgery to remove most or all of the thyroid gland (thyroidectomy). In some cases, treatment also involves removing the lymph nodes in the neck that are close to the thyroid. After surgery, you may have:  Thyroid hormone therapy. The therapy is done to: ? Replace the hormone in the body that is normally made by the thyroid. ? Suppress a thyroid-stimulating hormone (TSH) that activates the thyroid and makes any remaining cancer cells grow.  Radioactive iodine treatment. This treatment is  often used to destroy any remaining thyroid tissue and cancerous tissue that could not be seen and was not removed during the thyroidectomy. This treatment may also be used to treat thyroid cancer that has spread to other parts of the body or has recurred.  External radiation. This is typically done to treat thyroid cancer that has spread to the bones.  Chemotherapy  treatment. This is medicine that kills cancer cells and prevents them from growing.  Alcohol ablation. This kills cancer cells by injecting alcohol into the cells.  Biologic therapy or immunotherapy. This uses the body's own immune system to fight cancer cells.  Follow these instructions at home:  Take medicines only as directed by your health care provider.  Keep all follow-up visits as directed by your health care provider. This is important. Contact a health care provider if:  You feel nauseous or you vomit.  You have diarrhea.  You have a rash.  You have problems with urinating, such as: ? Having a burning sensation. ? Needing to urinate more often than usual. ? Pain or difficulty urinating. ? Having blood in your urine.  You have a new cough.  You have symptoms of too much thyroid hormone, such as: ? Nervousness or anxiety. ? Unintentional weight loss. ? Sweating. ? Difficulty sleeping. ? Hair loss. ? Heart palpitations. ? Frequent bowel movements.  You have symptoms of too little thyroid hormone, such as: ? Fatigue. ? Puffiness in your face, hands, or feet. ? Unintentional weight gain. ? Feeling cold. ? Constipation.  You have a fever. Get help right away if:  You have chest pain.  You have shortness of breath.  You suddenly feel too weak or dizzy to stand or walk. This information is not intended to replace advice given to you by your health care provider. Make sure you discuss any questions you have with your health care provider. Document Released: 10/19/2004 Document Revised: 07/29/2016 Document Reviewed: 04/29/2014 Elsevier Interactive Patient Education  2017 [employer].

## 2017-09-04 NOTE — Progress Notes (Signed)
Subjective:  By signing my name below, I, Becky Mueller, attest that this documentation has been prepared under the direction and in the presence of Delman Cheadle, MD Electronically Signed: Ladene Artist, ED Scribe 09/04/2017 at 2:24 PM.   Patient ID: Becky Mueller, female    DOB: Feb 27, 1956, 61 y.o.   MRN: [id number]  Chief Complaint  Patient presents with  . Follow-up    BP meds, Thyroid    HPI Becky Mueller is a 61 y.o. female who presents to Primary Care at [facility] for follow-up. Pt was seen 9 months ago, improvement in pre-diabetes A1C from 6.3 to 5.8. Thyroid panel was normal. I felt a mass on thyroid gland so sent pt for [place]. Small mass on thyroid that didn't meet criteria for biopsy. Seen earlier today by GYN for pap and manual breast exam but returns tomorrow for mammogram. Pt is not fasting at this visit.   Pt states she has been experiencing an aching, tender sensation in the front of her neck over the past few months and states that the thyroid mass feels larger to her. Pt is considering following up with an endocrinologist. Denies sore throat, diaphoresis, changes in energy or weight, family h/o thyroid disease. She retired in May and states she has began walking more with her dog over the past 2-3 months.   HTN Pt ran out of HCTZ a few weeks ago. She was prescribed 25 mg tablet but only taking half a tablet as the full tablet caused light-headedness. She has been monitoring her BP outside of the office with a morning reading of 140/80 today.  Arthritis Pt occasionally treats Arthritis with Aleve once in a while, especially during the day. She has tumeric but states she doesn't consistently take it.   Past Medical History:  Diagnosis Date  . Arthritis   . Cancer ([place]) 2004   skin  . Diverticulitis   . Hypertension   . Urolithiasis 1980s   Current Outpatient Prescriptions on File Prior to Visit  Medication Sig Dispense Refill  . hydrochlorothiazide (HYDRODIURIL) 25 MG tablet Take  1 tablet (25 mg total) by mouth daily. (Patient not taking: Reported on 09/04/2017) 90 tablet 2   No current facility-administered medications on file prior to visit.    Allergies  Allergen Reactions  . Septra [Bactrim] Hives  . Tetracyclines & Related Hives   Past Surgical History:  Procedure Laterality Date  . CESAREAN SECTION     x 2   Family History  Problem Relation Age of Onset  . Hypertension Mother   . COPD Mother        emphysema  . Cancer Sister 58       breast  . Arthritis Sister        RA  . Early death Paternal Uncle    Social History   Social History  . Marital status: Married    Spouse name: N/A  . Number of children: N/A  . Years of education: N/A   Social History Main Topics  . Smoking status: Never Smoker  . Smokeless tobacco: Never Used  . Alcohol use 0.0 oz/week    2 - 3 drink(s) per week  . Drug use: No  . Sexual activity: Yes   Other Topics Concern  . None   Social History Narrative  . None    Review of Systems  Constitutional: Negative for diaphoresis, fatigue and unexpected weight change.  HENT: Negative for sore throat.   Musculoskeletal: Positive for arthralgias and  neck pain (front).      Objective:   Physical Exam  Constitutional: She is oriented to person, place, and time. She appears well-developed and well-nourished. No distress.  HENT:  Head: Normocephalic and atraumatic.  Eyes: Conjunctivae and EOM are normal.  Neck: Neck supple. No tracheal deviation present.  No tenderness over tonsillar nodes, anterior cervical nodes not enlarged or tender. Tenderness to palpation immediatly over thyroid gland equally bilaterally. No enlargement of thyroid palpable. No Tenderness to palpation over SCM or other neck musculature.   Cardiovascular: Normal rate, regular rhythm and normal heart sounds.   Pulmonary/Chest: Effort normal and breath sounds normal. No respiratory distress.  Musculoskeletal: Normal range of motion.    Lymphadenopathy:       Head (right side): No preauricular and no posterior auricular adenopathy present.       Head (left side): No preauricular and no posterior auricular adenopathy present.       Right cervical: No posterior cervical adenopathy present.      Left cervical: No posterior cervical adenopathy present.       Right: No supraclavicular adenopathy present.       Left: No supraclavicular adenopathy present.  Neurological: She is alert and oriented to person, place, and time.  Skin: Skin is warm and dry.  Psychiatric: She has a normal mood and affect. Her behavior is normal.  Nursing note and vitals reviewed.  BP 134/80   Pulse 88   Temp 98 F (36.7 C) (Oral)   Resp 17   Ht 5' 1.5" (1.562 m)   Wt 147 lb (66.7 kg)   LMP 07/24/2011   SpO2 98%   BMI 27.33 kg/m     Assessment & Plan:    1. Essential hypertension, benign - refilled hctz - pt taking only 1/2 tab a day 12.5 -but wants to keep it written the way it currently is  2. Encounter for imaging to assess thyroid enlargement - was nml [place] in Jan - 9 mos prior but pt feels it is growing and is now rather anxious about it - due to concerns of thyroid enlarging and tender, may want to repeat the [place].  3. Anterior neck pain - pain does seem to locate only over thyroid gland B - check labs to r/u thyroiditis  4. Painful thyroid - pt's gyn - Dr. Cletis [facility] at [facility] has referred her to an endocrinologist but pt undecided on whether that is a necessary step. I do not appreciate anything of concern on her exam today other than her sx of tenderness.  5. Need for prophylactic vaccination and inoculation against influenza     Orders Placed This Encounter  Procedures  . Flu Vaccine QUAD 36+ mos IM  . Comprehensive metabolic panel  . Thyroid Panel With TSH  . C-reactive protein  . Thyroid antibodies  . Comprehensive metabolic panel  . POCT SEDIMENTATION RATE  . POCT CBC    Meds ordered this encounter  Medications  .  hydrochlorothiazide (HYDRODIURIL) 25 MG tablet    Sig: Take 1 tablet (25 mg total) by mouth daily.    Dispense:  90 tablet    Refill:  3    I personally performed the services described in this documentation, which was scribed in my presence. The recorded information has been reviewed and considered, and addended by me as needed.   Delman Cheadle, M.D.  Primary Care at [facility] [address] [place], [place] [address] [phone] phone [phone] fax  09/06/17 2:19 PM

## 2017-09-05 LAB — COMPREHENSIVE METABOLIC PANEL
A/G RATIO: 1.5 (ref 1.2–2.2)
ALK PHOS: 117 IU/L (ref 39–117)
ALT: 16 IU/L (ref 0–32)
AST: 20 IU/L (ref 0–40)
Albumin: 4.2 g/dL (ref 3.5–5.5)
BUN/Creatinine Ratio: 17 (ref 9–23)
BUN: 13 mg/dL (ref 6–24)
Bilirubin Total: 0.3 mg/dL (ref 0.0–1.2)
CALCIUM: 9.5 mg/dL (ref 8.7–10.2)
CHLORIDE: 104 mmol/L (ref 96–106)
CO2: 24 mmol/L (ref 20–29)
Creatinine, Ser: 0.78 mg/dL (ref 0.57–1.00)
GFR calc Af Amer: 97 mL/min/{1.73_m2} (ref >59)
GFR calc non Af Amer: 84 mL/min/{1.73_m2} (ref >59)
GLOBULIN, TOTAL: 2.8 g/dL (ref 1.5–4.5)
Glucose: 134 mg/dL — ABNORMAL HIGH (ref 65–99)
POTASSIUM: 3.9 mmol/L (ref 3.5–5.2)
SODIUM: 144 mmol/L (ref 134–144)
Total Protein: 7 g/dL (ref 6.0–8.5)

## 2017-09-05 LAB — THYROID ANTIBODIES: THYROID PEROXIDASE ANTIBODY: 12 [IU]/mL (ref 0–34)

## 2017-09-05 LAB — C-REACTIVE PROTEIN: CRP: 1.8 mg/L (ref 0.0–4.9)

## 2017-09-10 LAB — SPECIMEN STATUS REPORT

## 2017-09-10 LAB — THYROID PANEL WITH TSH
Free Thyroxine Index: 1.5 (ref 1.2–4.9)
T3 UPTAKE RATIO: 29 % (ref 24–39)
T4 TOTAL: 5.2 ug/dL (ref 4.5–12.0)
TSH: 1.04 u[IU]/mL (ref 0.450–4.500)

## 2018-06-30 ENCOUNTER — Ambulatory Visit: Payer: BC Managed Care – PPO | Admitting: Family Medicine

## 2018-07-02 ENCOUNTER — Ambulatory Visit: Payer: BC Managed Care – PPO | Admitting: Emergency Medicine

## 2018-07-28 ENCOUNTER — Other Ambulatory Visit: Payer: Self-pay | Admitting: Obstetrics and Gynecology

## 2018-07-28 DIAGNOSIS — Z1231 Encounter for screening mammogram for malignant neoplasm of breast: Secondary | ICD-10-CM

## 2018-09-09 ENCOUNTER — Other Ambulatory Visit: Payer: Self-pay | Admitting: Family Medicine

## 2018-09-09 ENCOUNTER — Ambulatory Visit
Admission: RE | Admit: 2018-09-09 | Discharge: 2018-09-09 | Disposition: A | Discharge status: Home / Self Care | Payer: BC Managed Care – PPO | Source: Ambulatory Visit | Attending: Obstetrics and Gynecology | Admitting: Obstetrics and Gynecology

## 2018-09-09 DIAGNOSIS — Z1231 Encounter for screening mammogram for malignant neoplasm of breast: Secondary | ICD-10-CM

## 2019-01-09 ENCOUNTER — Telehealth: Payer: Self-pay | Admitting: Family Medicine

## 2019-01-09 NOTE — Telephone Encounter (Signed)
Copied from [place] [id number]. Topic: Referral - Request for Referral >> Jan 09, 2019  8:50 AM Alanda Slim E wrote: Has patient seen PCP for this complaint? No  *If NO, is insurance requiring patient see PCP for this issue before PCP can refer them? Referral for which specialty: Mammogram/breast ultra sound/Diagnostics Preferred provider/office: [place]  Reason for referral: left Breast tenderness and pain   Pt went to Breast center to be seen and was advised that the Breast center needs order sent so that Pt can have an appt for diagnostics and ultra sound or mammogram / please advise

## 2019-01-22 NOTE — Telephone Encounter (Signed)
See message below °

## 2019-01-26 ENCOUNTER — Encounter: Payer: Self-pay | Admitting: Family Medicine

## 2019-01-26 NOTE — Telephone Encounter (Signed)
Pt had a normal mammogram in 09/2018 and has not been seen in our office in a year and a half.    Please offer her an acute appointment or same day appointment with any one of our providers asap for clinical evaluation.  Only with a history and exam can we make sure that she gets the necessary appropriate imaging and treatment.

## 2019-01-27 NOTE — Telephone Encounter (Signed)
Per Dr. Raul Del note, I LVM for pt to call the office and schedule an appt. We will have to open a sameday -Per DR> SHAW- so please call [place] and ask for Carolynne Edouard or Brandi. Thank you!

## 2019-01-27 NOTE — Telephone Encounter (Signed)
Called pt and informed her that she needs an appointment to be evaluated before a referral can be sent.

## 2019-02-24 ENCOUNTER — Other Ambulatory Visit: Payer: Self-pay

## 2019-02-24 ENCOUNTER — Telehealth: Payer: Self-pay | Admitting: Family Medicine

## 2019-02-24 DIAGNOSIS — I10 Essential (primary) hypertension: Secondary | ICD-10-CM

## 2019-02-24 MED ORDER — HYDROCHLOROTHIAZIDE 25 MG PO TABS: 25.0000 mg | ORAL_TABLET | Freq: Every day | ORAL | 0 refills | Status: DC

## 2019-02-24 NOTE — Telephone Encounter (Signed)
Copied from [place] [phone] [id number]. Topic: General - Other >> Feb 24, 2019  9:13 AM Marin Olp L wrote: Reason for CRM: Patient wants to know if there a doctor in the office can refill hydrochlorothiazide (HYDRODIURIL) 25 MG tablet for her since Dr. Brigitte Pulse is no longer at the office? She would like a 90 days supply and does not want to come in due to corona virus. Says she will schedule a TOC given she can get her refill.

## 2019-02-24 NOTE — Telephone Encounter (Signed)
Spoke with pt and informed her that I will send a 30 day supply to her pharmacy and she will have to schedule an appointment for further refill, she verbalized understanding.

## 2019-03-19 ENCOUNTER — Other Ambulatory Visit: Payer: Self-pay | Admitting: Family Medicine

## 2019-03-19 DIAGNOSIS — I10 Essential (primary) hypertension: Secondary | ICD-10-CM

## 2019-03-19 NOTE — Telephone Encounter (Signed)
Requested medication (s) are due for refill today: yes  Requested medication (s) are on the active medication list: yes  Last refill:  02/24/2019  Future visit scheduled: No needs appointnment.  Notes to clinic:  Notes in chart say pt was going to set up [facility] Not documented    Requested Prescriptions  Pending Prescriptions Disp Refills   hydrochlorothiazide (HYDRODIURIL) 25 MG tablet [Pharmacy Med Name: HYDROCHLOROTHIAZIDE 25 MG TAB] 30 tablet 0    Sig: TAKE 1 TABLET BY MOUTH EVERY DAY     Cardiovascular: Diuretics - Thiazide Failed - 03/19/2019 12:30 PM      Failed - Ca in normal range and within 360 days    Calcium  Date Value Ref Range Status  09/04/2017 9.5 8.7 - 10.2 mg/dL Final         Failed - Cr in normal range and within 360 days    Creat  Date Value Ref Range Status  07/05/2016 0.78 0.50 - 1.05 mg/dL Final    Comment:      For patients > or = 61 years of age: The upper reference limit for Creatinine is approximately 13% higher for people identified as African-American.      Creatinine, Ser  Date Value Ref Range Status  09/04/2017 0.78 0.57 - 1.00 mg/dL Final         Failed - K in normal range and within 360 days    Potassium  Date Value Ref Range Status  09/04/2017 3.9 3.5 - 5.2 mmol/L Final         Failed - Na in normal range and within 360 days    Sodium  Date Value Ref Range Status  09/04/2017 144 134 - 144 mmol/L Final         Failed - Valid encounter within last 6 months    Recent Outpatient Visits          1 year ago Essential hypertension, benign   [facility], Laurey Arrow, MD   2 years ago Acute non-recurrent sinusitis, unspecified location   [facility], Laurey Arrow, MD   2 years ago Essential hypertension   [facility], Laurey Arrow, MD   3 years ago Essential hypertension   [facility], Laurey Arrow, MD   4 years ago Dysuria   Primary Care at  [facility], Gay Filler, MD             Passed  - Last BP in normal range    BP Readings from Last 1 Encounters:  09/04/17 134/80

## 2019-03-23 ENCOUNTER — Telehealth: Payer: Self-pay

## 2019-03-23 ENCOUNTER — Other Ambulatory Visit: Payer: Self-pay | Admitting: Family Medicine

## 2019-03-23 DIAGNOSIS — I10 Essential (primary) hypertension: Secondary | ICD-10-CM

## 2019-03-23 NOTE — Telephone Encounter (Signed)
Requested medication (s) are due for refill today: Yes  Requested medication (s) are on the active medication list: Yes  Last refill: 02/24/19  Future visit scheduled: No  Notes to clinic:  Will need an appointment, unable to refill     Requested Prescriptions  Pending Prescriptions Disp Refills   hydrochlorothiazide (HYDRODIURIL) 25 MG tablet [Pharmacy Med Name: HYDROCHLOROTHIAZIDE 25 MG TAB] 30 tablet 0    Sig: TAKE 1 TABLET BY MOUTH EVERY DAY     Cardiovascular: Diuretics - Thiazide Failed - 03/23/2019 12:38 PM      Failed - Ca in normal range and within 360 days    Calcium  Date Value Ref Range Status  09/04/2017 9.5 8.7 - 10.2 mg/dL Final         Failed - Cr in normal range and within 360 days    Creat  Date Value Ref Range Status  07/05/2016 0.78 0.50 - 1.05 mg/dL Final    Comment:      For patients > or = 61 years of age: The upper reference limit for Creatinine is approximately 13% higher for people identified as African-American.      Creatinine, Ser  Date Value Ref Range Status  09/04/2017 0.78 0.57 - 1.00 mg/dL Final         Failed - K in normal range and within 360 days    Potassium  Date Value Ref Range Status  09/04/2017 3.9 3.5 - 5.2 mmol/L Final         Failed - Na in normal range and within 360 days    Sodium  Date Value Ref Range Status  09/04/2017 144 134 - 144 mmol/L Final         Failed - Valid encounter within last 6 months    Recent Outpatient Visits          1 year ago Essential hypertension, benign   [facility], Laurey Arrow, MD   2 years ago Acute non-recurrent sinusitis, unspecified location   [facility], Laurey Arrow, MD   2 years ago Essential hypertension   [facility], Laurey Arrow, MD   3 years ago Essential hypertension   [facility], Laurey Arrow, MD   4 years ago Dysuria   [facility] at [facility], Gay Filler, MD             Passed - Last BP in normal range    BP  Readings from Last 1 Encounters:  09/04/17 134/80

## 2019-03-23 NOTE — Telephone Encounter (Signed)
Called pt letting her know that she needs to toc and cannot get another refill until she has some labs

## 2019-03-23 NOTE — Telephone Encounter (Signed)
Patient called, left VM to return call to the office to schedule an OV in order to receive refills.

## 2019-05-26 ENCOUNTER — Other Ambulatory Visit: Payer: Self-pay | Admitting: Obstetrics and Gynecology

## 2019-05-26 DIAGNOSIS — Z1231 Encounter for screening mammogram for malignant neoplasm of breast: Secondary | ICD-10-CM

## 2019-09-11 ENCOUNTER — Ambulatory Visit
Admission: RE | Admit: 2019-09-11 | Discharge: 2019-09-11 | Disposition: A | Discharge status: Home / Self Care | Payer: BC Managed Care – PPO | Source: Ambulatory Visit | Attending: Obstetrics and Gynecology | Admitting: Obstetrics and Gynecology

## 2019-09-11 ENCOUNTER — Other Ambulatory Visit: Payer: Self-pay

## 2019-09-11 DIAGNOSIS — Z1231 Encounter for screening mammogram for malignant neoplasm of breast: Secondary | ICD-10-CM

## 2019-10-30 ENCOUNTER — Ambulatory Visit (INDEPENDENT_AMBULATORY_CARE_PROVIDER_SITE_OTHER): Payer: BC Managed Care – PPO | Admitting: Family Medicine

## 2019-10-30 ENCOUNTER — Other Ambulatory Visit: Payer: Self-pay

## 2019-10-30 ENCOUNTER — Encounter: Payer: Self-pay | Admitting: Family Medicine

## 2019-10-30 VITALS — BP 145/93 | HR 88 | Temp 98.4°F | Resp 16 | Ht 62.4 in | Wt 153.0 lb

## 2019-10-30 DIAGNOSIS — R0982 Postnasal drip: Secondary | ICD-10-CM | POA: Diagnosis not present

## 2019-10-30 DIAGNOSIS — Z0001 Encounter for general adult medical examination with abnormal findings: Secondary | ICD-10-CM | POA: Diagnosis not present

## 2019-10-30 DIAGNOSIS — R062 Wheezing: Secondary | ICD-10-CM

## 2019-10-30 DIAGNOSIS — Z0000 Encounter for general adult medical examination without abnormal findings: Secondary | ICD-10-CM

## 2019-10-30 DIAGNOSIS — M8580 Other specified disorders of bone density and structure, unspecified site: Secondary | ICD-10-CM

## 2019-10-30 DIAGNOSIS — I10 Essential (primary) hypertension: Secondary | ICD-10-CM

## 2019-10-30 DIAGNOSIS — Z87898 Personal history of other specified conditions: Secondary | ICD-10-CM

## 2019-10-30 MED ORDER — AMLODIPINE BESYLATE 2.5 MG PO TABS: 2.5000 mg | ORAL_TABLET | Freq: Every day | ORAL | 0 refills | Status: DC

## 2019-10-30 NOTE — Progress Notes (Signed)
11/20/202010:55 AM  Becky Mueller 1956/06/09, 63 y.o., female [id number]  Chief Complaint  Patient presents with  . Annual Exam    HPI:   Patient is a 63 y.o. female with past medical history significant for HTN who presents today for CPE  Cervical Cancer Screening: due 2021 Breast Cancer Screening: due 2022 Colorectal Cancer Screening: due 2024 Bone Density Testing: done by obgyn, osteopenia, started taking 2000units a day, walks 3 miles a day Seasonal Influenza Vaccination: had this season Pneumococcal Vaccination: at age 47 Frequency of Dental evaluation: Q6 months Frequency of Eye evaluation: yearly  Does not want to take HCTZ anymore, concerned as she is prediabetic. Lisinopril gave her dry cough. She is working on [employer] - exercise, low salt diet  She is also having constant post nasal drip with some sneezing. No itchy eyes or ears. She has occasional cough and sometimes herself wheezes when she laughs, never has any issues when she exercises. Denies any chest tightness or SOB.   Hearing Screening   125Hz  250Hz  500Hz  1000Hz  2000Hz  3000Hz  4000Hz  6000Hz  8000Hz   Right ear:           Left ear:             Visual Acuity Screening   Right eye Left eye Both eyes  Without correction: 20/30 20/30 20/20   With correction:       Depression screen [facility] 2/9 10/30/2019 09/04/2017 12/08/2016  Decreased Interest 1 0 0  Down, Depressed, Hopeless 1 0 0  PHQ - 2 Score 2 0 0  Altered sleeping 0 - -  Tired, decreased energy 1 - -  Change in appetite 0 - -  Feeling bad or failure about yourself  0 - -  Trouble concentrating 0 - -  Moving slowly or fidgety/restless 0 - -  Suicidal thoughts 0 - -  PHQ-9 Score 3 - -    Fall Risk  10/30/2019 09/04/2017 12/08/2016 07/05/2016 07/16/2014  Falls in the past year? 1 No No No No  Number falls in past yr: 0 - - - -  Injury with Fall? 0 - - - -     Allergies  Allergen Reactions  . Septra [Bactrim] Hives  . Tetracyclines & Related Hives     Prior to Admission medications   Medication Sig Start Date End Date Taking? Authorizing Provider  Calcium Polycarbophil (FIBER-CAPS PO) Take by mouth.   Yes [provider]  influenza vac recom quadrivalent (FLUBLOK QUADRIVALENT) 0.5 ML injection Flublok Quad 2020-2021 (PF) 180 mcg (45 mcg x 4)/0.5 mL IM syringe   Yes [provider]  Multiple Vitamins-Minerals (MEGA MULTI WOMEN PO) Take by mouth.   Yes [provider]  Vitamin D, Ergocalciferol, 50 MCG (2000 UT) CAPS Take by mouth.   Yes [provider]    Past Medical History:  Diagnosis Date  . Arthritis   . Cancer ([place]) 2004   skin  . Diverticulitis   . Hypertension   . Urolithiasis 1980s    Past Surgical History:  Procedure Laterality Date  . CESAREAN SECTION     x 2    Social History   Tobacco Use  . Smoking status: Never Smoker  . Smokeless tobacco: Never Used  Substance Use Topics  . Alcohol use: Yes    Alcohol/week: 2.0 - 3.0 standard drinks    Types: 2 - 3 drink(s) per week    Family History  Problem Relation Age of Onset  . Hypertension Mother   .  COPD Mother        emphysema  . Cancer Sister 22       breast  . Arthritis Sister        RA  . Breast cancer Sister 33  . Early death Paternal Uncle     Review of Systems  Constitutional: Negative for chills and fever.  Respiratory: Positive for cough and wheezing. Negative for shortness of breath.   Cardiovascular: Negative for chest pain, palpitations and leg swelling.  Gastrointestinal: Negative for abdominal pain, nausea and vomiting.  All other systems reviewed and are negative.  Per hpi  OBJECTIVE:  Today's Vitals   10/30/19 1047  BP: (!) 145/93  Pulse: 88  Resp: 16  Temp: 98.4 F (36.9 C)  TempSrc: Oral  SpO2: 98%  Weight: 153 lb (69.4 kg)  Height: 5' 2.4" (1.585 m)   Body mass index is 27.62 kg/m.  Wt Readings from Last 3 Encounters:  10/30/19 153 lb (69.4 kg)  09/04/17 147 lb (66.7 kg)   12/08/16 145 lb (65.8 kg)    Physical Exam Vitals signs and nursing note reviewed.  Constitutional:      Appearance: She is well-developed.  HENT:     Head: Normocephalic and atraumatic.     Right Ear: Hearing, tympanic membrane, ear canal and external ear normal.     Left Ear: Hearing, tympanic membrane, ear canal and external ear normal.     Mouth/Throat:     Mouth: Mucous membranes are moist.     Pharynx: No oropharyngeal exudate or posterior oropharyngeal erythema.  Eyes:     Extraocular Movements: Extraocular movements intact.     Conjunctiva/sclera: Conjunctivae normal.     Pupils: Pupils are equal, round, and reactive to light.  Neck:     Musculoskeletal: Neck supple.     Thyroid: No thyromegaly.  Cardiovascular:     Rate and Rhythm: Normal rate and regular rhythm.     Heart sounds: Normal heart sounds. No murmur. No friction rub. No gallop.   Pulmonary:     Effort: Pulmonary effort is normal.     Breath sounds: Normal breath sounds. No wheezing, rhonchi or rales.  Abdominal:     General: Bowel sounds are normal. There is no distension.     Palpations: Abdomen is soft. There is no hepatomegaly, splenomegaly or mass.     Tenderness: There is no abdominal tenderness.  Musculoskeletal: Normal range of motion.     Right lower leg: No edema.     Left lower leg: No edema.  Lymphadenopathy:     Cervical: No cervical adenopathy.  Skin:    General: Skin is warm and dry.  Neurological:     Mental Status: She is alert and oriented to person, place, and time.     Cranial Nerves: No cranial nerve deficit.     Gait: Gait normal.     Deep Tendon Reflexes: Reflexes are normal and symmetric.  Psychiatric:        Mood and Affect: Mood normal.        Behavior: Behavior normal.     No results found for this or any previous visit (from the past 24 hour(s)).  No results found.   ASSESSMENT and PLAN  1. Annual physical exam Routine HCM labs ordered. HCM reviewed/discussed.  Anticipatory guidance regarding healthy weight, lifestyle and choices given.   2. Osteopenia, unspecified location - Vitamin D, 25-hydroxy  3. Essential hypertension, benign Changed hctz to amlodipine. Reviewed r/se/b. Cont with LFM and  home monitoring.  - CBC with Differential - Comprehensive metabolic panel - Lipid panel - TSH  4. Wheezing None on exam, no chest tightness or SOB, not with activity. ?gerd? Trial of PPI. Consider PFTs  5. Postnasal drip Discussed oral antihistamine +/- nasal steroids  6. History of prediabetes - Hemoglobin A1c  Other orders - influenza vac recom quadrivalent (FLUBLOK QUADRIVALENT) 0.5 ML injection; Flublok Quad 2020-2021 (PF) 180 mcg (45 mcg x 4)/0.5 mL IM syringe - Vitamin D, Ergocalciferol, 50 MCG (2000 UT) CAPS; Take by mouth. - Calcium Polycarbophil (FIBER-CAPS PO); Take by mouth. - Multiple Vitamins-Minerals (MEGA MULTI WOMEN PO); Take by mouth. - amLODipine (NORVASC) 2.5 MG tablet; Take 1 tablet (2.5 mg total) by mouth daily.  Return in about 3 months (around 01/30/2020).    Rutherford Guys, MD Primary Care at [facility] [place], [place] [address] Ph.  [phone] Fax [phone]

## 2019-10-30 NOTE — Patient Instructions (Addendum)
   If you have lab work done today you will be contacted with your lab results within the next 2 weeks.  If you have not heard from us then please contact us. The fastest way to get your results is to register for My Chart.   IF you received an x-ray today, you will receive an invoice from [facility]. Please contact [facility] at [phone] with questions or concerns regarding your invoice.   IF you received labwork today, you will receive an invoice from [facility]. Please contact [facility] at [phone] with questions or concerns regarding your invoice.   Our billing staff will not be able to assist you with questions regarding bills from these companies.  You will be contacted with the lab results as soon as they are available. The fastest way to get your results is to activate your My Chart account. Instructions are located on the last page of this paperwork. If you have not heard from us regarding the results in 2 weeks, please contact this office.     Preventive Care 40-64 Years Old, Female Preventive care refers to visits with your health care provider and lifestyle choices that can promote health and wellness. This includes:  A yearly physical exam. This may also be called an annual well check.  Regular dental visits and eye exams.  Immunizations.  Screening for certain conditions.  Healthy lifestyle choices, such as eating a healthy diet, getting regular exercise, not using drugs or products that contain nicotine and tobacco, and limiting alcohol use. What can I expect for my preventive care visit? Physical exam Your health care provider will check your:  Height and weight. This may be used to calculate body mass index (BMI), which tells if you are at a healthy weight.  Heart rate and blood pressure.  Skin for abnormal spots. Counseling Your health care provider may ask you questions about your:  Alcohol, tobacco, and drug use.  Emotional  well-being.  Home and relationship well-being.  Sexual activity.  Eating habits.  Work and work environment.  Method of birth control.  Menstrual cycle.  Pregnancy history. What immunizations do I need?  Influenza (flu) vaccine  This is recommended every year. Tetanus, diphtheria, and pertussis (Tdap) vaccine  You may need a Td booster every 10 years. Varicella (chickenpox) vaccine  You may need this if you have not been vaccinated. Zoster (shingles) vaccine  You may need this after age 60. Measles, mumps, and rubella (MMR) vaccine  You may need at least one dose of MMR if you were born in 1957 or later. You may also need a second dose. Pneumococcal conjugate (PCV13) vaccine  You may need this if you have certain conditions and were not previously vaccinated. Pneumococcal polysaccharide (PPSV23) vaccine  You may need one or two doses if you smoke cigarettes or if you have certain conditions. Meningococcal conjugate (MenACWY) vaccine  You may need this if you have certain conditions. Hepatitis A vaccine  You may need this if you have certain conditions or if you travel or work in places where you may be exposed to hepatitis A. Hepatitis B vaccine  You may need this if you have certain conditions or if you travel or work in places where you may be exposed to hepatitis B. Haemophilus influenzae type b (Hib) vaccine  You may need this if you have certain conditions. Human papillomavirus (HPV) vaccine  If recommended by your health care provider, you may need three doses over 6 months.   You may receive vaccines as individual doses or as more than one vaccine together in one shot (combination vaccines). Talk with your health care provider about the risks and benefits of combination vaccines. What tests do I need? Blood tests  Lipid and cholesterol levels. These may be checked every 5 years, or more frequently if you are over 50 years old.  Hepatitis C  test.  Hepatitis B test. Screening  Lung cancer screening. You may have this screening every year starting at age 55 if you have a 30-pack-year history of smoking and currently smoke or have quit within the past 15 years.  Colorectal cancer screening. All adults should have this screening starting at age 50 and continuing until age 75. Your health care provider may recommend screening at age 45 if you are at increased risk. You will have tests every 1-10 years, depending on your results and the type of screening test.  Diabetes screening. This is done by checking your blood sugar (glucose) after you have not eaten for a while (fasting). You may have this done every 1-3 years.  Mammogram. This may be done every 1-2 years. Talk with your health care provider about when you should start having regular mammograms. This may depend on whether you have a family history of breast cancer.  BRCA-related cancer screening. This may be done if you have a family history of breast, ovarian, tubal, or peritoneal cancers.  Pelvic exam and Pap test. This may be done every 3 years starting at age 21. Starting at age 30, this may be done every 5 years if you have a Pap test in combination with an HPV test. Other tests  Sexually transmitted disease (STD) testing.  Bone density scan. This is done to screen for osteoporosis. You may have this scan if you are at high risk for osteoporosis. Follow these instructions at home: Eating and drinking  Eat a diet that includes fresh fruits and vegetables, whole grains, lean protein, and low-fat dairy.  Take vitamin and mineral supplements as recommended by your health care provider.  Do not drink alcohol if: ? Your health care provider tells you not to drink. ? You are pregnant, may be pregnant, or are planning to become pregnant.  If you drink alcohol: ? Limit how much you have to 0-1 drink a day. ? Be aware of how much alcohol is in your drink. In the [place] one  drink equals one 12 oz bottle of beer (355 mL), one 5 oz glass of wine (148 mL), or one 1 oz glass of hard liquor (44 mL). Lifestyle  Take daily care of your teeth and gums.  Stay active. Exercise for at least 30 minutes on 5 or more days each week.  Do not use any products that contain nicotine or tobacco, such as cigarettes, e-cigarettes, and chewing tobacco. If you need help quitting, ask your health care provider.  If you are sexually active, practice safe sex. Use a condom or other form of birth control (contraception) in order to prevent pregnancy and STIs (sexually transmitted infections).  If told by your health care provider, take low-dose aspirin daily starting at age 50. What's next?  Visit your health care provider once a year for a well check visit.  Ask your health care provider how often you should have your eyes and teeth checked.  Stay up to date on all vaccines. This information is not intended to replace advice given to you by your health care provider. Make sure   you discuss any questions you have with your health care provider. Document Released: 12/23/2015 Document Revised: 08/07/2018 Document Reviewed: 08/07/2018 Elsevier Patient Education  2020 Elsevier Inc.  

## 2019-10-31 LAB — CBC WITH DIFFERENTIAL/PLATELET
Basophils Absolute: 0.1 10*3/uL (ref 0.0–0.2)
Basos: 1 %
EOS (ABSOLUTE): 0.1 10*3/uL (ref 0.0–0.4)
Eos: 3 %
Hematocrit: 40.5 % (ref 34.0–46.6)
Hemoglobin: 13.9 g/dL (ref 11.1–15.9)
Immature Grans (Abs): 0 10*3/uL (ref 0.0–0.1)
Immature Granulocytes: 0 %
Lymphocytes Absolute: 1.8 10*3/uL (ref 0.7–3.1)
Lymphs: 40 %
MCH: 31 pg (ref 26.6–33.0)
MCHC: 34.3 g/dL (ref 31.5–35.7)
MCV: 90 fL (ref 79–97)
Monocytes Absolute: 0.3 10*3/uL (ref 0.1–0.9)
Monocytes: 6 %
Neutrophils Absolute: 2.2 10*3/uL (ref 1.4–7.0)
Neutrophils: 50 %
Platelets: 243 10*3/uL (ref 150–450)
RBC: 4.49 x10E6/uL (ref 3.77–5.28)
RDW: 11.8 % (ref 11.7–15.4)
WBC: 4.5 10*3/uL (ref 3.4–10.8)

## 2019-10-31 LAB — VITAMIN D 25 HYDROXY (VIT D DEFICIENCY, FRACTURES): Vit D, 25-Hydroxy: 28.9 ng/mL — ABNORMAL LOW (ref 30.0–100.0)

## 2019-10-31 LAB — COMPREHENSIVE METABOLIC PANEL
ALT: 17 IU/L (ref 0–32)
AST: 22 IU/L (ref 0–40)
Albumin/Globulin Ratio: 1.4 (ref 1.2–2.2)
Albumin: 4.1 g/dL (ref 3.8–4.8)
Alkaline Phosphatase: 148 IU/L — ABNORMAL HIGH (ref 39–117)
BUN/Creatinine Ratio: 16 (ref 12–28)
BUN: 12 mg/dL (ref 8–27)
Bilirubin Total: 0.4 mg/dL (ref 0.0–1.2)
CO2: 22 mmol/L (ref 20–29)
Calcium: 9.4 mg/dL (ref 8.7–10.3)
Chloride: 103 mmol/L (ref 96–106)
Creatinine, Ser: 0.77 mg/dL (ref 0.57–1.00)
GFR calc Af Amer: 96 mL/min/{1.73_m2} (ref >59)
GFR calc non Af Amer: 84 mL/min/{1.73_m2} (ref >59)
Globulin, Total: 3 g/dL (ref 1.5–4.5)
Glucose: 157 mg/dL — ABNORMAL HIGH (ref 65–99)
Potassium: 4 mmol/L (ref 3.5–5.2)
Sodium: 140 mmol/L (ref 134–144)
Total Protein: 7.1 g/dL (ref 6.0–8.5)

## 2019-10-31 LAB — LIPID PANEL
Chol/HDL Ratio: 2.9 ratio (ref 0.0–4.4)
Cholesterol, Total: 243 mg/dL — ABNORMAL HIGH (ref 100–199)
HDL: 84 mg/dL (ref >39)
LDL Chol Calc (NIH): 149 mg/dL — ABNORMAL HIGH (ref 0–99)
Triglycerides: 61 mg/dL (ref 0–149)
VLDL Cholesterol Cal: 10 mg/dL (ref 5–40)

## 2019-10-31 LAB — TSH: TSH: 1.08 u[IU]/mL (ref 0.450–4.500)

## 2019-10-31 LAB — HEMOGLOBIN A1C
Est. average glucose Bld gHb Est-mCnc: 134 mg/dL
Hgb A1c MFr Bld: 6.3 % — ABNORMAL HIGH (ref 4.8–5.6)

## 2019-11-02 ENCOUNTER — Other Ambulatory Visit: Payer: Self-pay | Admitting: Family Medicine

## 2019-11-02 MED ORDER — VITAMIN D (ERGOCALCIFEROL) 50 MCG (2000 UT) PO CAPS: 1.0000 | ORAL_CAPSULE | ORAL | 0 refills | Status: AC

## 2019-12-29 ENCOUNTER — Other Ambulatory Visit: Payer: Self-pay

## 2019-12-29 ENCOUNTER — Ambulatory Visit (INDEPENDENT_AMBULATORY_CARE_PROVIDER_SITE_OTHER): Payer: BC Managed Care – PPO | Admitting: Family Medicine

## 2019-12-29 ENCOUNTER — Encounter: Payer: Self-pay | Admitting: Family Medicine

## 2019-12-29 VITALS — BP 124/84 | HR 84 | Temp 98.3°F | Ht 64.2 in | Wt 153.0 lb

## 2019-12-29 DIAGNOSIS — E559 Vitamin D deficiency, unspecified: Secondary | ICD-10-CM | POA: Diagnosis not present

## 2019-12-29 DIAGNOSIS — R062 Wheezing: Secondary | ICD-10-CM | POA: Diagnosis not present

## 2019-12-29 DIAGNOSIS — R7303 Prediabetes: Secondary | ICD-10-CM | POA: Diagnosis not present

## 2019-12-29 DIAGNOSIS — I10 Essential (primary) hypertension: Secondary | ICD-10-CM

## 2019-12-29 MED ORDER — OMEPRAZOLE 20 MG PO CPDR: 20.0000 mg | DELAYED_RELEASE_CAPSULE | Freq: Every day | ORAL | 5 refills | Status: AC

## 2019-12-29 NOTE — Progress Notes (Signed)
1/19/20219:14 AM  Becky Mueller Apr 07, 1956, 64 y.o., female [id number]  Chief Complaint  Patient presents with  . Hypertension    wants to talk about bp med, Taking 2.5mg , monitors daily readings 140's/90's. She will bring cuff in to do compare    HPI:   Patient is a 64 y.o. female with past medical history significant for HTN, HLP, prediabetes, osteopenia, vitamin D deficiency who presents today for routine followup  Last OV Nov 2020 - changed HCTZ to amlodipine due to concerns of possible risk of developing DM Also started trial of PPI for nocturnal wheezing  She was worried about her BP not being controlled, at home she was getting 140/90s.  She takes her BP med at night but checks her BP during the day She does have a high salt diet Drinks 2-3 glasses of wine a day She has restarted resistance training   PND much better on flonase She did not start PPI, did not receive rx  Lab Results  Component Value Date   HGBA1C 6.3 (H) 10/30/2019    Depression screen [facility] 2/9 10/30/2019 09/04/2017 12/08/2016  Decreased Interest 1 0 0  Down, Depressed, Hopeless 1 0 0  PHQ - 2 Score 2 0 0  Altered sleeping 0 - -  Tired, decreased energy 1 - -  Change in appetite 0 - -  Feeling bad or failure about yourself  0 - -  Trouble concentrating 0 - -  Moving slowly or fidgety/restless 0 - -  Suicidal thoughts 0 - -  PHQ-9 Score 3 - -    Fall Risk  12/29/2019 10/30/2019 09/04/2017 12/08/2016 07/05/2016  Falls in the past year? 0 1 No No No  Number falls in past yr: 0 0 - - -  Injury with Fall? 0 0 - - -     Allergies  Allergen Reactions  . Septra [Bactrim] Hives  . Tetracyclines & Related Hives    Prior to Admission medications   Medication Sig Start Date End Date Taking? Authorizing Provider  amLODipine (NORVASC) 2.5 MG tablet Take 1 tablet (2.5 mg total) by mouth daily. 10/30/19  Yes Rutherford Guys, MD  Calcium Polycarbophil (FIBER-CAPS PO) Take by mouth.   Yes [provider]  influenza vac recom quadrivalent (FLUBLOK QUADRIVALENT) 0.5 ML injection Flublok Quad 2020-2021 (PF) 180 mcg (45 mcg x 4)/0.5 mL IM syringe   Yes [provider]  Multiple Vitamins-Minerals (MEGA MULTI WOMEN PO) Take by mouth.   Yes [provider]  Vitamin D, Ergocalciferol, 50 MCG (2000 UT) CAPS Take 1 capsule by mouth once a week. 11/02/19  Yes Rutherford Guys, MD    Past Medical History:  Diagnosis Date  . Arthritis   . Cancer ([place]) 2004   skin  . Diverticulitis   . Hypertension   . Urolithiasis 1980s    Past Surgical History:  Procedure Laterality Date  . CESAREAN SECTION     x 2    Social History   Tobacco Use  . Smoking status: Never Smoker  . Smokeless tobacco: Never Used  Substance Use Topics  . Alcohol use: Yes    Alcohol/week: 2.0 - 3.0 standard drinks    Types: 2 - 3 drink(s) per week    Family History  Problem Relation Age of Onset  . Hypertension Mother   . COPD Mother        emphysema  . Cancer Sister 27       breast  . Arthritis Sister  RA  . Breast cancer Sister 40  . Early death Paternal Uncle     Review of Systems  Constitutional: Negative for chills and fever.  Respiratory: Negative for cough and shortness of breath.   Cardiovascular: Negative for chest pain, palpitations and leg swelling.  Gastrointestinal: Negative for abdominal pain, nausea and vomiting.  per hpi   OBJECTIVE:  Today's Vitals   12/29/19 0848  BP: 124/84  Pulse: 84  Temp: 98.3 F (36.8 C)  SpO2: 99%  Weight: 153 lb (69.4 kg)  Height: 5' 4.2" (1.631 m)   Body mass index is 26.1 kg/m.   Physical Exam Vitals and nursing note reviewed.  Constitutional:      Appearance: She is well-developed.  HENT:     Head: Normocephalic and atraumatic.     Mouth/Throat:     Pharynx: No oropharyngeal exudate.  Eyes:     General: No scleral icterus.    Conjunctiva/sclera: Conjunctivae normal.     Pupils: Pupils are equal, round,  and reactive to light.  Cardiovascular:     Rate and Rhythm: Normal rate and regular rhythm.     Heart sounds: Normal heart sounds. No murmur. No friction rub. No gallop.   Pulmonary:     Effort: Pulmonary effort is normal.     Breath sounds: Normal breath sounds. No wheezing or rales.  Musculoskeletal:     Cervical back: Neck supple.     Right lower leg: No edema.     Left lower leg: No edema.  Skin:    General: Skin is warm and dry.  Neurological:     Mental Status: She is alert and oriented to person, place, and time.     No results found for this or any previous visit (from the past 24 hour(s)).  No results found.   ASSESSMENT and PLAN  1. Essential hypertension, benign Controlled. Continue current regime. Discussed at length LFM. Cont with home BP monitoring  2. Vitamin D deficiency Checking levels today. Resume 2000 units daily. - VITAMIN D 25 Hydroxy (Vit-D Deficiency, Fractures)  3. Wheezing Thought to be 2/2 GERD, start PPI, discussed LFM  4. Prediabetes Discussed LFM, consider metformin  Other orders - omeprazole (PRILOSEC) 20 MG capsule; Take 1 capsule (20 mg total) by mouth at bedtime.  Return in about 6 months (around 06/27/2020).    Rutherford Guys, MD Primary Care at [facility] [place], [place] [address] Ph.  [phone] Fax [phone]

## 2019-12-29 NOTE — Patient Instructions (Addendum)
   If you have lab work done today you will be contacted with your lab results within the next 2 weeks.  If you have not heard from us then please contact us. The fastest way to get your results is to register for My Chart.   IF you received an x-ray today, you will receive an invoice from [facility]. Please contact [facility] at [phone] with questions or concerns regarding your invoice.   IF you received labwork today, you will receive an invoice from [facility]. Please contact [facility] at [phone] with questions or concerns regarding your invoice.   Our billing staff will not be able to assist you with questions regarding bills from these companies.  You will be contacted with the lab results as soon as they are available. The fastest way to get your results is to activate your My Chart account. Instructions are located on the last page of this paperwork. If you have not heard from us regarding the results in 2 weeks, please contact this office.     DASH Eating Plan DASH stands for "Dietary Approaches to Stop Hypertension." The DASH eating plan is a healthy eating plan that has been shown to reduce high blood pressure (hypertension). It may also reduce your risk for type 2 diabetes, heart disease, and stroke. The DASH eating plan may also help with weight loss. What are tips for following this plan?  General guidelines  Avoid eating more than 2,300 mg (milligrams) of salt (sodium) a day. If you have hypertension, you may need to reduce your sodium intake to 1,500 mg a day.  Limit alcohol intake to no more than 1 drink a day for nonpregnant women and 2 drinks a day for men. One drink equals 12 oz of beer, 5 oz of wine, or 1 oz of hard liquor.  Work with your health care provider to maintain a healthy body weight or to lose weight. Ask what an ideal weight is for you.  Get at least 30 minutes of exercise that causes your heart to beat faster (aerobic  exercise) most days of the week. Activities may include walking, swimming, or biking.  Work with your health care provider or diet and nutrition specialist (dietitian) to adjust your eating plan to your individual calorie needs. Reading food labels   Check food labels for the amount of sodium per serving. Choose foods with less than 5 percent of the Daily Value of sodium. Generally, foods with less than 300 mg of sodium per serving fit into this eating plan.  To find whole grains, look for the word "whole" as the first word in the ingredient list. Shopping  Buy products labeled as "low-sodium" or "no salt added."  Buy fresh foods. Avoid canned foods and premade or frozen meals. Cooking  Avoid adding salt when cooking. Use salt-free seasonings or herbs instead of table salt or sea salt. Check with your health care provider or pharmacist before using salt substitutes.  Do not fry foods. Cook foods using healthy methods such as baking, boiling, grilling, and broiling instead.  Cook with heart-healthy oils, such as olive, canola, soybean, or sunflower oil. Meal planning  Eat a balanced diet that includes: ? 5 or more servings of fruits and vegetables each day. At each meal, try to fill half of your plate with fruits and vegetables. ? Up to 6-8 servings of whole grains each day. ? Less than 6 oz of lean meat, poultry, or fish each day. A 3-oz serving   of meat is about the same size as a deck of cards. One egg equals 1 oz. ? 2 servings of low-fat dairy each day. ? A serving of nuts, seeds, or beans 5 times each week. ? Heart-healthy fats. Healthy fats called Omega-3 fatty acids are found in foods such as flaxseeds and coldwater fish, like sardines, salmon, and mackerel.  Limit how much you eat of the following: ? Canned or prepackaged foods. ? Food that is high in trans fat, such as fried foods. ? Food that is high in saturated fat, such as fatty meat. ? Sweets, desserts, sugary drinks,  and other foods with added sugar. ? Full-fat dairy products.  Do not salt foods before eating.  Try to eat at least 2 vegetarian meals each week.  Eat more home-cooked food and less restaurant, buffet, and fast food.  When eating at a restaurant, ask that your food be prepared with less salt or no salt, if possible. What foods are recommended? The items listed may not be a complete list. Talk with your dietitian about what dietary choices are best for you. Grains Whole-grain or whole-wheat bread. Whole-grain or whole-wheat pasta. Brown rice. Oatmeal. Quinoa. Bulgur. Whole-grain and low-sodium cereals. Pita bread. Low-fat, low-sodium crackers. Whole-wheat flour tortillas. Vegetables Fresh or frozen vegetables (raw, steamed, roasted, or grilled). Low-sodium or reduced-sodium tomato and vegetable juice. Low-sodium or reduced-sodium tomato sauce and tomato paste. Low-sodium or reduced-sodium canned vegetables. Fruits All fresh, dried, or frozen fruit. Canned fruit in natural juice (without added sugar). Meat and other protein foods Skinless chicken or turkey. Ground chicken or turkey. Pork with fat trimmed off. Fish and seafood. Egg whites. Dried beans, peas, or lentils. Unsalted nuts, nut butters, and seeds. Unsalted canned beans. Lean cuts of beef with fat trimmed off. Low-sodium, lean deli meat. Dairy Low-fat (1%) or fat-free (skim) milk. Fat-free, low-fat, or reduced-fat cheeses. Nonfat, low-sodium ricotta or cottage cheese. Low-fat or nonfat yogurt. Low-fat, low-sodium cheese. Fats and oils Soft margarine without trans fats. Vegetable oil. Low-fat, reduced-fat, or light mayonnaise and salad dressings (reduced-sodium). Canola, safflower, olive, soybean, and sunflower oils. Avocado. Seasoning and other foods Herbs. Spices. Seasoning mixes without salt. Unsalted popcorn and pretzels. Fat-free sweets. What foods are not recommended? The items listed may not be a complete list. Talk with your  dietitian about what dietary choices are best for you. Grains Baked goods made with fat, such as croissants, muffins, or some breads. Dry pasta or rice meal packs. Vegetables Creamed or fried vegetables. Vegetables in a cheese sauce. Regular canned vegetables (not low-sodium or reduced-sodium). Regular canned tomato sauce and paste (not low-sodium or reduced-sodium). Regular tomato and vegetable juice (not low-sodium or reduced-sodium). Pickles. Olives. Fruits Canned fruit in a light or heavy syrup. Fried fruit. Fruit in cream or butter sauce. Meat and other protein foods Fatty cuts of meat. Ribs. Fried meat. Bacon. Sausage. Bologna and other processed lunch meats. Salami. Fatback. Hotdogs. Bratwurst. Salted nuts and seeds. Canned beans with added salt. Canned or smoked fish. Whole eggs or egg yolks. Chicken or turkey with skin. Dairy Whole or 2% milk, cream, and half-and-half. Whole or full-fat cream cheese. Whole-fat or sweetened yogurt. Full-fat cheese. Nondairy creamers. Whipped toppings. Processed cheese and cheese spreads. Fats and oils Butter. Stick margarine. Lard. Shortening. Ghee. Bacon fat. Tropical oils, such as coconut, palm kernel, or palm oil. Seasoning and other foods Salted popcorn and pretzels. Onion salt, garlic salt, seasoned salt, table salt, and sea salt. Worcestershire sauce. Tartar sauce. Barbecue sauce.   Teriyaki sauce. Soy sauce, including reduced-sodium. Steak sauce. Canned and packaged gravies. Fish sauce. Oyster sauce. Cocktail sauce. Horseradish that you find on the shelf. Ketchup. Mustard. Meat flavorings and tenderizers. Bouillon cubes. Hot sauce and Tabasco sauce. Premade or packaged marinades. Premade or packaged taco seasonings. Relishes. Regular salad dressings. Where to find more information:  National Heart, Lung, and Blood Institute: www.nhlbi.nih.gov  American Heart Association: www.heart.org Summary  The DASH eating plan is a healthy eating plan that has  been shown to reduce high blood pressure (hypertension). It may also reduce your risk for type 2 diabetes, heart disease, and stroke.  With the DASH eating plan, you should limit salt (sodium) intake to 2,300 mg a day. If you have hypertension, you may need to reduce your sodium intake to 1,500 mg a day.  When on the DASH eating plan, aim to eat more fresh fruits and vegetables, whole grains, lean proteins, low-fat dairy, and heart-healthy fats.  Work with your health care provider or diet and nutrition specialist (dietitian) to adjust your eating plan to your individual calorie needs. This information is not intended to replace advice given to you by your health care provider. Make sure you discuss any questions you have with your health care provider. Document Revised: 11/08/2017 Document Reviewed: 11/19/2016 Elsevier Patient Education  2020 Elsevier Inc.  

## 2019-12-30 LAB — VITAMIN D 25 HYDROXY (VIT D DEFICIENCY, FRACTURES): Vit D, 25-Hydroxy: 48.6 ng/mL (ref 30.0–100.0)

## 2020-01-26 ENCOUNTER — Other Ambulatory Visit: Payer: Self-pay | Admitting: Family Medicine

## 2020-01-28 ENCOUNTER — Ambulatory Visit: Payer: BC Managed Care – PPO | Admitting: Family Medicine

## 2020-01-29 ENCOUNTER — Other Ambulatory Visit: Payer: Self-pay | Admitting: Family Medicine

## 2020-07-29 ENCOUNTER — Other Ambulatory Visit: Payer: Self-pay | Admitting: Family Medicine

## 2020-07-29 MED ORDER — AMLODIPINE BESYLATE 2.5 MG PO TABS: 2.5000 mg | ORAL_TABLET | Freq: Every day | ORAL | 0 refills | Status: DC

## 2020-07-29 NOTE — Telephone Encounter (Signed)
Medication Refill - Medication: Amlodopomine  Has the patient contacted their pharmacy? No. Advised patient to contact the pharmacy as well.  (Agent: If no, request that the patient contact the pharmacy for the refill.) (Agent: If yes, when and what did the pharmacy advise?)  Preferred Pharmacy (with phone number or street name): [facility]   Agent: Please be advised that RX refills may take up to 3 business days. We ask that you follow-up with your pharmacy.

## 2020-08-08 ENCOUNTER — Encounter: Payer: Self-pay | Admitting: Family Medicine

## 2020-08-08 ENCOUNTER — Ambulatory Visit: Payer: BC Managed Care – PPO | Admitting: Family Medicine

## 2020-08-08 ENCOUNTER — Other Ambulatory Visit: Payer: Self-pay

## 2020-08-08 VITALS — BP 133/84 | HR 94 | Temp 98.3°F | Ht 64.2 in | Wt 154.0 lb

## 2020-08-08 DIAGNOSIS — I10 Essential (primary) hypertension: Secondary | ICD-10-CM | POA: Diagnosis not present

## 2020-08-08 DIAGNOSIS — Z23 Encounter for immunization: Secondary | ICD-10-CM

## 2020-08-08 MED ORDER — AMLODIPINE BESYLATE 2.5 MG PO TABS: 2.5000 mg | ORAL_TABLET | Freq: Every day | ORAL | 1 refills | Status: AC

## 2020-08-08 NOTE — Patient Instructions (Signed)
° ° ° °  If you have lab work done today you will be contacted with your lab results within the next 2 weeks.  If you have not heard from us then please contact us. The fastest way to get your results is to register for My Chart. ° ° °IF you received an x-ray today, you will receive an invoice from [facility]. Please contact [facility] at [phone] with questions or concerns regarding your invoice.  ° °IF you received labwork today, you will receive an invoice from [facility]. Please contact [facility] at [phone] with questions or concerns regarding your invoice.  ° °Our billing staff will not be able to assist you with questions regarding bills from these companies. ° °You will be contacted with the lab results as soon as they are available. The fastest way to get your results is to activate your My Chart account. Instructions are located on the last page of this paperwork. If you have not heard from us regarding the results in 2 weeks, please contact this office. °  ° ° ° °

## 2020-08-08 NOTE — Progress Notes (Signed)
8/30/20212:56 PM  Becky Mueller 03-May-1956, 64 y.o., female [id number]  Chief Complaint  Patient presents with  . Medication Refill    wanting to wait for lab until cpe in Nov    HPI:   Patient is a 64 y.o. female with past medical history significant for HTN, HLP, prediabetes, osteopenia, vitamin D deficiency who presents today for routine followup   Last OV Jan 2021  She is overall doing well She has been working really hard on LFM She has been working on Charity fundraiser 3 x week She will start HIIT  She is working on intermittent fasting She continues to take mvit, ca, vitamin D She cont to take amlodipine daily Rarely a bot dizzy if she stands to fast    Wt Readings from Last 3 Encounters:  08/08/20 154 lb (69.9 kg)  12/29/19 153 lb (69.4 kg)  10/30/19 153 lb (69.4 kg)   BP Readings from Last 3 Encounters:  08/08/20 133/84  12/29/19 124/84  10/30/19 (!) 145/93    Depression screen PHQ 2/9 10/30/2019 09/04/2017 12/08/2016  Decreased Interest 1 0 0  Down, Depressed, Hopeless 1 0 0  PHQ - 2 Score 2 0 0  Altered sleeping 0 - -  Tired, decreased energy 1 - -  Change in appetite 0 - -  Feeling bad or failure about yourself  0 - -  Trouble concentrating 0 - -  Moving slowly or fidgety/restless 0 - -  Suicidal thoughts 0 - -  PHQ-9 Score 3 - -    Fall Risk  12/29/2019 10/30/2019 09/04/2017 12/08/2016 07/05/2016  Falls in the past year? 0 1 No No No  Number falls in past yr: 0 0 - - -  Injury with Fall? 0 0 - - -     Allergies  Allergen Reactions  . Septra [Bactrim] Hives  . Tetracyclines & Related Hives    Prior to Admission medications   Medication Sig Start Date End Date Taking? Authorizing Provider  amLODipine (NORVASC) 2.5 MG tablet Take 1 tablet (2.5 mg total) by mouth daily. 07/29/20  Yes Rutherford Guys, MD  Calcium Polycarbophil (FIBER-CAPS PO) Take by mouth.   Yes [provider]  Multiple Vitamins-Minerals (MEGA MULTI WOMEN PO) Take  by mouth.   Yes [provider]  omeprazole (PRILOSEC) 20 MG capsule Take 1 capsule (20 mg total) by mouth at bedtime. 12/29/19  Yes Rutherford Guys, MD  Vitamin D, Ergocalciferol, 50 MCG (2000 UT) CAPS Take 1 capsule by mouth once a week. 11/02/19  Yes Rutherford Guys, MD    Past Medical History:  Diagnosis Date  . Arthritis   . Cancer ([place]) 2004   skin  . Diverticulitis   . Hypertension   . Urolithiasis 1980s    Past Surgical History:  Procedure Laterality Date  . CESAREAN SECTION     x 2    Social History   Tobacco Use  . Smoking status: Never Smoker  . Smokeless tobacco: Never Used  Substance Use Topics  . Alcohol use: Yes    Alcohol/week: 2.0 - 3.0 standard drinks    Types: 2 - 3 drink(s) per week    Family History  Problem Relation Age of Onset  . Hypertension Mother   . COPD Mother        emphysema  . Cancer Sister 63       breast  . Arthritis Sister        RA  . Breast cancer Sister  9  . Early death Paternal Uncle     Review of Systems  Constitutional: Negative for chills and fever.  Respiratory: Negative for cough and shortness of breath.   Cardiovascular: Negative for chest pain, palpitations and leg swelling.  Gastrointestinal: Negative for abdominal pain, nausea and vomiting.  per hpi   OBJECTIVE:  Today's Vitals   08/08/20 1451  BP: 133/84  Pulse: 94  Temp: 98.3 F (36.8 C)  SpO2: 98%  Weight: 154 lb (69.9 kg)  Height: 5' 4.2" (1.631 m)   Body mass index is 26.27 kg/m.   Physical Exam Vitals and nursing note reviewed.  Constitutional:      Appearance: She is well-developed.  HENT:     Head: Normocephalic and atraumatic.     Mouth/Throat:     Pharynx: No oropharyngeal exudate.  Eyes:     General: No scleral icterus.    Extraocular Movements: Extraocular movements intact.     Conjunctiva/sclera: Conjunctivae normal.     Pupils: Pupils are equal, round, and reactive to light.  Cardiovascular:     Rate and Rhythm:  Normal rate and regular rhythm.     Heart sounds: Normal heart sounds. No murmur heard.  No friction rub. No gallop.   Pulmonary:     Effort: Pulmonary effort is normal.     Breath sounds: Normal breath sounds. No wheezing, rhonchi or rales.  Musculoskeletal:     Cervical back: Neck supple.  Skin:    General: Skin is warm and dry.  Neurological:     Mental Status: She is alert and oriented to person, place, and time.     No results found for this or any previous visit (from the past 24 hour(s)).  No results found.   ASSESSMENT and PLAN  1. Essential hypertension, benign Controlled. Continue current regime. Cont LFM.  2. Need for vaccination  Other orders - Flu Vaccine QUAD 36+ mos IM - amLODipine (NORVASC) 2.5 MG tablet; Take 1 tablet (2.5 mg total) by mouth daily.  Return for after nov 20th for CPE.    Rutherford Guys, MD [facility] [place], [place] [address] Ph.  [phone] Fax [phone]

## 2020-08-21 ENCOUNTER — Ambulatory Visit: Payer: BC Managed Care – PPO | Attending: Internal Medicine

## 2020-08-21 ENCOUNTER — Other Ambulatory Visit: Payer: Self-pay

## 2020-08-21 DIAGNOSIS — Z23 Encounter for immunization: Secondary | ICD-10-CM

## 2020-08-21 NOTE — Progress Notes (Signed)
   Covid-19 Vaccination Clinic  Name:  Tierra Thoma    MRN: [id number] DOB: Jul 13, 1956  08/21/2020  Ms. Kendrick was observed post Covid-19 immunization for 15 minutes without incident. She was provided with Vaccine Information Sheet and instruction to access the V-Safe system.   Ms. Supak was instructed to call [phone] with any severe reactions post vaccine: Marland Kitchen Difficulty breathing  . Swelling of face and throat  . A fast heartbeat  . A bad rash all over body  . Dizziness and weakness

## 2021-10-11 ENCOUNTER — Other Ambulatory Visit: Payer: Self-pay | Admitting: Obstetrics and Gynecology

## 2021-10-11 DIAGNOSIS — Z1231 Encounter for screening mammogram for malignant neoplasm of breast: Secondary | ICD-10-CM

## 2021-11-14 ENCOUNTER — Ambulatory Visit
Admission: RE | Admit: 2021-11-14 | Discharge: 2021-11-14 | Disposition: A | Discharge status: Home / Self Care | Payer: BC Managed Care – PPO | Source: Ambulatory Visit

## 2021-11-14 DIAGNOSIS — Z1231 Encounter for screening mammogram for malignant neoplasm of breast: Secondary | ICD-10-CM

## 2022-04-11 ENCOUNTER — Other Ambulatory Visit: Payer: Self-pay | Admitting: Obstetrics and Gynecology

## 2022-04-11 DIAGNOSIS — M8580 Other specified disorders of bone density and structure, unspecified site: Secondary | ICD-10-CM

## 2022-10-02 ENCOUNTER — Ambulatory Visit
Admission: RE | Admit: 2022-10-02 | Discharge: 2022-10-02 | Disposition: A | Discharge status: Home / Self Care | Payer: BC Managed Care – PPO | Source: Ambulatory Visit | Attending: Obstetrics and Gynecology | Admitting: Obstetrics and Gynecology

## 2022-10-02 DIAGNOSIS — M8580 Other specified disorders of bone density and structure, unspecified site: Secondary | ICD-10-CM

## 2022-10-23 ENCOUNTER — Other Ambulatory Visit: Payer: Self-pay | Admitting: Obstetrics and Gynecology

## 2022-10-23 DIAGNOSIS — Z1231 Encounter for screening mammogram for malignant neoplasm of breast: Secondary | ICD-10-CM

## 2022-11-20 ENCOUNTER — Ambulatory Visit
Admission: RE | Admit: 2022-11-20 | Discharge: 2022-11-20 | Disposition: A | Discharge status: Home / Self Care | Payer: BC Managed Care – PPO | Source: Ambulatory Visit | Attending: Obstetrics and Gynecology | Admitting: Obstetrics and Gynecology

## 2022-11-20 DIAGNOSIS — Z1231 Encounter for screening mammogram for malignant neoplasm of breast: Secondary | ICD-10-CM

## 2023-02-14 IMAGING — MG MM DIGITAL SCREENING BILAT W/ TOMO AND CAD
8 series · 8 of 24 positions shown · non-contrast
Comparison: Previous exam(s).

CLINICAL DATA: Screening.

EXAM:
DIGITAL SCREENING BILATERAL MAMMOGRAM WITH TOMOSYNTHESIS AND CAD
TECHNIQUE: Bilateral screening digital craniocaudal and mediolateral oblique
mammograms were obtained. Bilateral screening digital breast
tomosynthesis was performed. The images were evaluated with
computer-aided detection.

[L CC synth-2D]
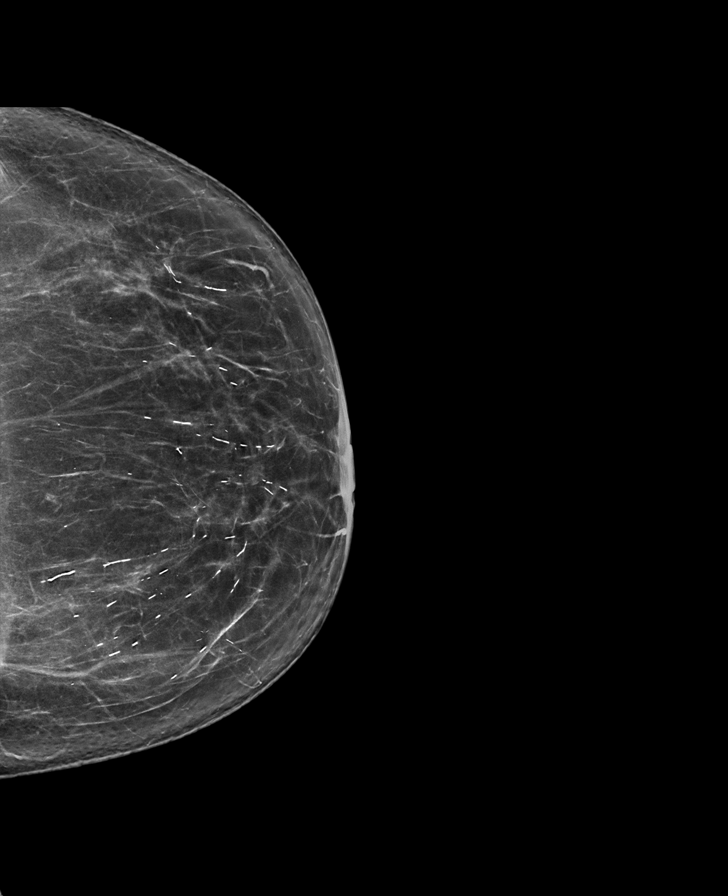

[R MLO synth-2D]
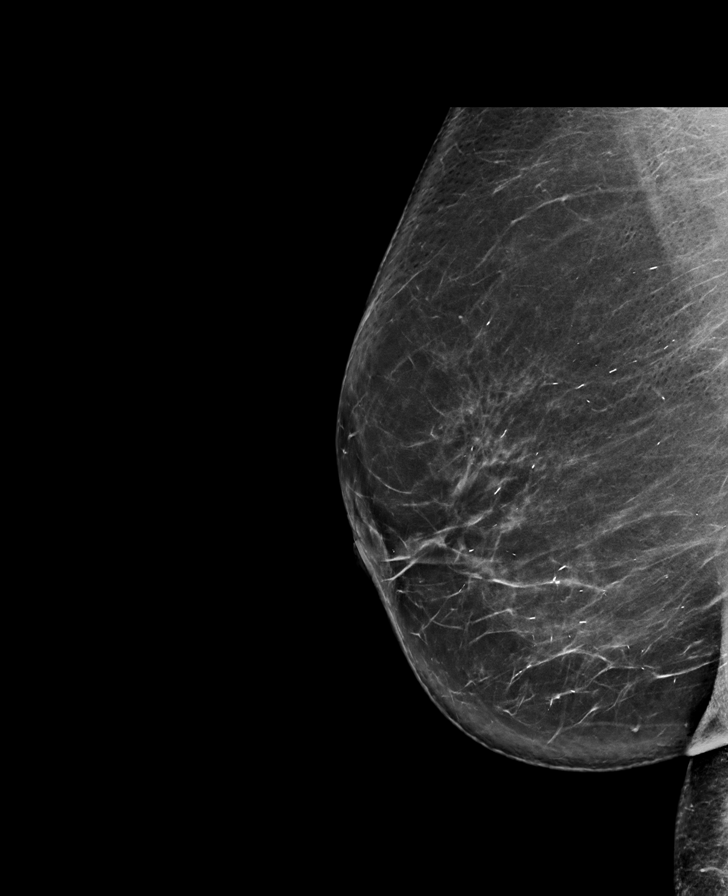

[L MLO synth-2D]
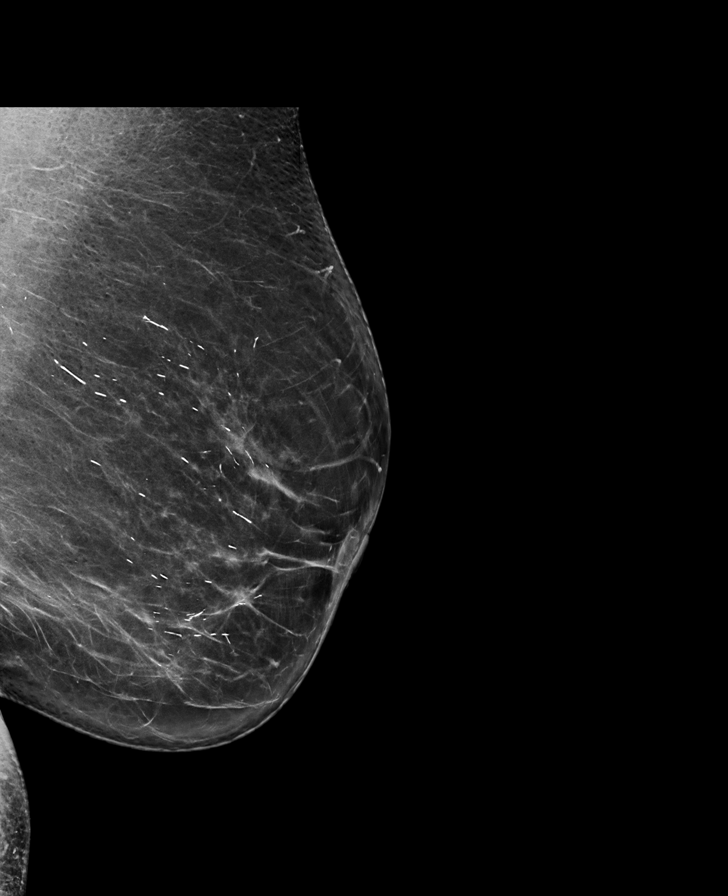

[R CC synth-2D]
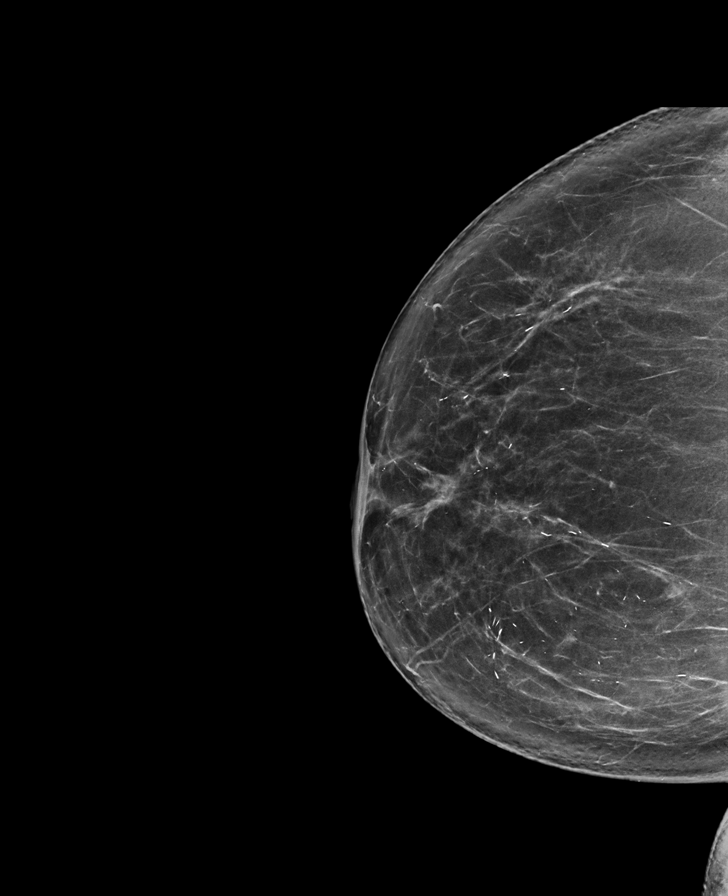

[L CC tomo · tomo slice 41/80.0]
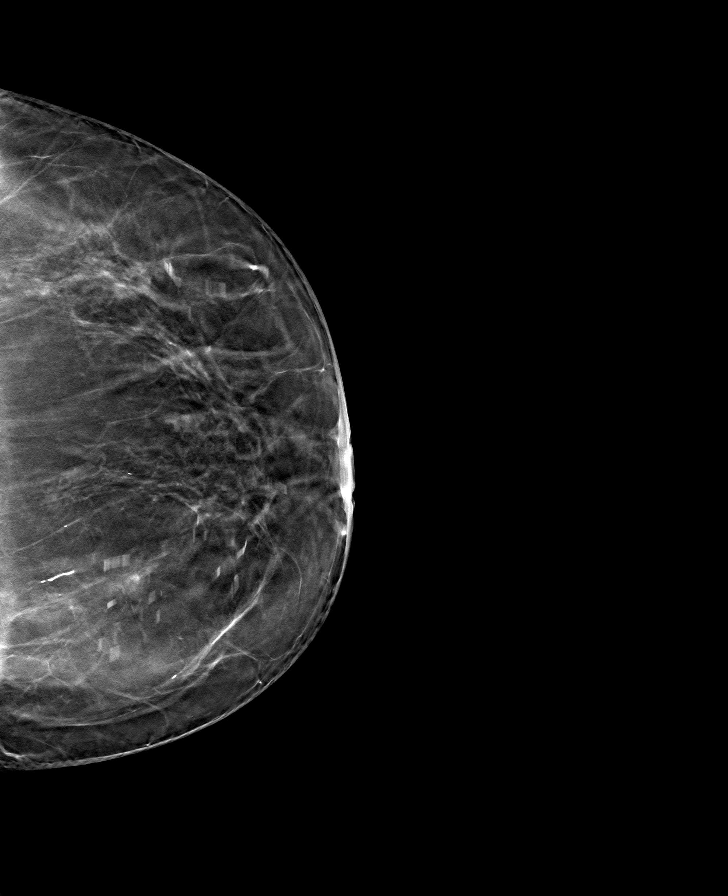

[L MLO tomo · tomo slice 45/88.0]
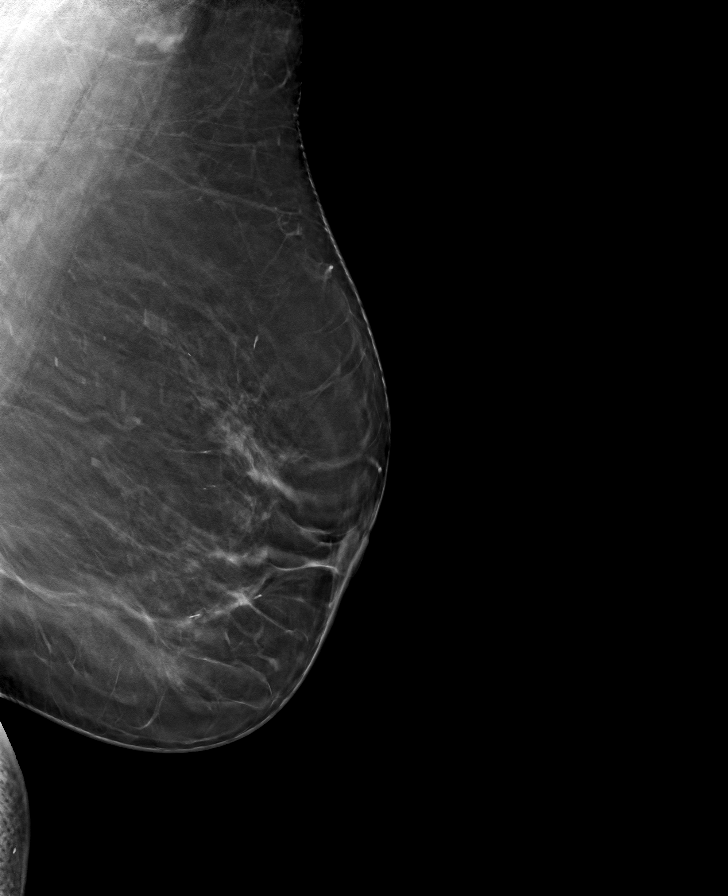

[R MLO tomo · tomo slice 46/91.0]
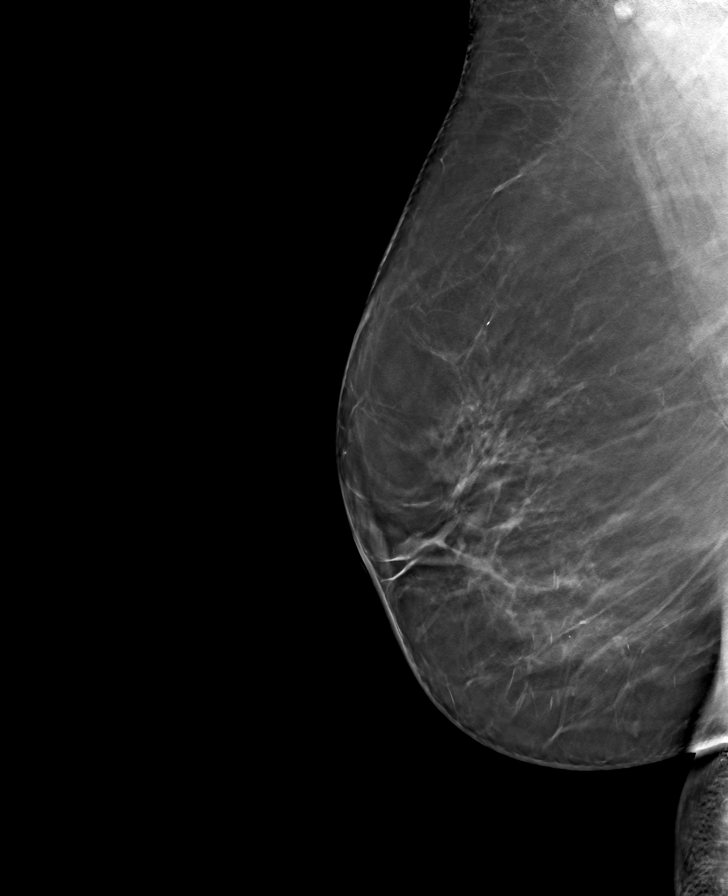

[R CC tomo · tomo slice 43/84.0]
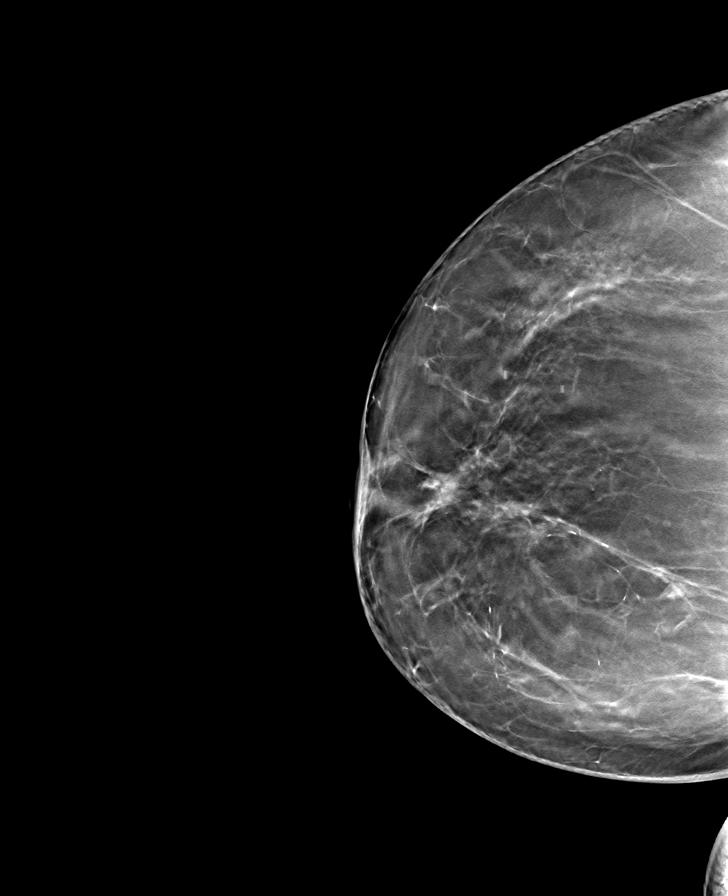

[8 of 24 positions shown; findings below may reference images not displayed]

ACR Breast Density Category b: There are scattered areas of
fibroglandular density.
FINDINGS: There are no findings suspicious for malignancy.
IMPRESSION: No mammographic evidence of malignancy. A result letter of this
screening mammogram will be mailed directly to the patient.

RECOMMENDATION:
Screening mammogram in one year. (Code:51-O-LD2)

BI-RADS CATEGORY  1: Negative.

## 2023-10-15 ENCOUNTER — Other Ambulatory Visit: Payer: Self-pay | Admitting: Family Medicine

## 2023-10-15 DIAGNOSIS — Z1231 Encounter for screening mammogram for malignant neoplasm of breast: Secondary | ICD-10-CM

## 2023-11-11 DIAGNOSIS — I10 Essential (primary) hypertension: Secondary | ICD-10-CM | POA: Diagnosis not present

## 2023-11-11 DIAGNOSIS — E118 Type 2 diabetes mellitus with unspecified complications: Secondary | ICD-10-CM | POA: Diagnosis not present

## 2023-11-11 DIAGNOSIS — E538 Deficiency of other specified B group vitamins: Secondary | ICD-10-CM | POA: Diagnosis not present

## 2023-11-11 DIAGNOSIS — E7800 Pure hypercholesterolemia, unspecified: Secondary | ICD-10-CM | POA: Diagnosis not present

## 2023-11-11 DIAGNOSIS — E559 Vitamin D deficiency, unspecified: Secondary | ICD-10-CM | POA: Diagnosis not present

## 2023-11-12 DIAGNOSIS — E7800 Pure hypercholesterolemia, unspecified: Secondary | ICD-10-CM | POA: Diagnosis not present

## 2023-11-12 DIAGNOSIS — E118 Type 2 diabetes mellitus with unspecified complications: Secondary | ICD-10-CM | POA: Diagnosis not present

## 2023-11-12 DIAGNOSIS — E559 Vitamin D deficiency, unspecified: Secondary | ICD-10-CM | POA: Diagnosis not present

## 2023-11-12 DIAGNOSIS — I10 Essential (primary) hypertension: Secondary | ICD-10-CM | POA: Diagnosis not present

## 2023-11-12 DIAGNOSIS — E538 Deficiency of other specified B group vitamins: Secondary | ICD-10-CM | POA: Diagnosis not present

## 2023-11-12 DIAGNOSIS — M8580 Other specified disorders of bone density and structure, unspecified site: Secondary | ICD-10-CM | POA: Diagnosis not present

## 2023-11-12 DIAGNOSIS — Z7185 Encounter for immunization safety counseling: Secondary | ICD-10-CM | POA: Diagnosis not present

## 2023-11-12 DIAGNOSIS — M79602 Pain in left arm: Secondary | ICD-10-CM | POA: Diagnosis not present

## 2023-11-12 DIAGNOSIS — Z0000 Encounter for general adult medical examination without abnormal findings: Secondary | ICD-10-CM | POA: Diagnosis not present

## 2023-11-14 ENCOUNTER — Other Ambulatory Visit: Payer: Self-pay | Admitting: Family Medicine

## 2023-11-14 DIAGNOSIS — M8580 Other specified disorders of bone density and structure, unspecified site: Secondary | ICD-10-CM

## 2023-11-26 ENCOUNTER — Ambulatory Visit
Admission: RE | Admit: 2023-11-26 | Discharge: 2023-11-26 | Disposition: A | Discharge status: Home / Self Care | Payer: Medicare PPO | Source: Ambulatory Visit | Attending: Family Medicine | Admitting: Family Medicine

## 2023-11-26 DIAGNOSIS — Z1231 Encounter for screening mammogram for malignant neoplasm of breast: Secondary | ICD-10-CM

## 2023-12-17 DIAGNOSIS — C44111 Basal cell carcinoma of skin of unspecified eyelid, including canthus: Secondary | ICD-10-CM | POA: Diagnosis not present

## 2024-02-19 DIAGNOSIS — M79602 Pain in left arm: Secondary | ICD-10-CM | POA: Diagnosis not present

## 2024-02-19 DIAGNOSIS — G5603 Carpal tunnel syndrome, bilateral upper limbs: Secondary | ICD-10-CM | POA: Diagnosis not present

## 2024-03-09 DIAGNOSIS — H11152 Pinguecula, left eye: Secondary | ICD-10-CM | POA: Diagnosis not present

## 2024-03-09 DIAGNOSIS — H524 Presbyopia: Secondary | ICD-10-CM | POA: Diagnosis not present

## 2024-03-09 DIAGNOSIS — H25813 Combined forms of age-related cataract, bilateral: Secondary | ICD-10-CM | POA: Diagnosis not present

## 2024-03-09 DIAGNOSIS — E119 Type 2 diabetes mellitus without complications: Secondary | ICD-10-CM | POA: Diagnosis not present

## 2024-04-30 DIAGNOSIS — E559 Vitamin D deficiency, unspecified: Secondary | ICD-10-CM | POA: Diagnosis not present

## 2024-04-30 DIAGNOSIS — I10 Essential (primary) hypertension: Secondary | ICD-10-CM | POA: Diagnosis not present

## 2024-04-30 DIAGNOSIS — E118 Type 2 diabetes mellitus with unspecified complications: Secondary | ICD-10-CM | POA: Diagnosis not present

## 2024-04-30 DIAGNOSIS — E538 Deficiency of other specified B group vitamins: Secondary | ICD-10-CM | POA: Diagnosis not present

## 2024-04-30 DIAGNOSIS — E7800 Pure hypercholesterolemia, unspecified: Secondary | ICD-10-CM | POA: Diagnosis not present

## 2024-04-30 DIAGNOSIS — M8588 Other specified disorders of bone density and structure, other site: Secondary | ICD-10-CM | POA: Diagnosis not present

## 2024-04-30 DIAGNOSIS — Z79899 Other long term (current) drug therapy: Secondary | ICD-10-CM | POA: Diagnosis not present

## 2024-06-17 DIAGNOSIS — K5730 Diverticulosis of large intestine without perforation or abscess without bleeding: Secondary | ICD-10-CM | POA: Diagnosis not present

## 2024-06-17 DIAGNOSIS — K635 Polyp of colon: Secondary | ICD-10-CM | POA: Diagnosis not present

## 2024-06-17 DIAGNOSIS — Z860101 Personal history of adenomatous and serrated colon polyps: Secondary | ICD-10-CM | POA: Diagnosis not present

## 2024-06-17 DIAGNOSIS — Z09 Encounter for follow-up examination after completed treatment for conditions other than malignant neoplasm: Secondary | ICD-10-CM | POA: Diagnosis not present

## 2024-06-17 DIAGNOSIS — Z1211 Encounter for screening for malignant neoplasm of colon: Secondary | ICD-10-CM | POA: Diagnosis not present

## 2024-06-26 DIAGNOSIS — Z124 Encounter for screening for malignant neoplasm of cervix: Secondary | ICD-10-CM | POA: Diagnosis not present

## 2024-06-26 DIAGNOSIS — E119 Type 2 diabetes mellitus without complications: Secondary | ICD-10-CM | POA: Diagnosis not present

## 2024-06-26 DIAGNOSIS — Z01419 Encounter for gynecological examination (general) (routine) without abnormal findings: Secondary | ICD-10-CM | POA: Diagnosis not present

## 2024-07-16 ENCOUNTER — Other Ambulatory Visit: Payer: Self-pay

## 2024-10-14 ENCOUNTER — Ambulatory Visit (HOSPITAL_BASED_OUTPATIENT_CLINIC_OR_DEPARTMENT_OTHER)
Admission: RE | Admit: 2024-10-14 | Discharge: 2024-10-14 | Disposition: A | Discharge status: Home / Self Care | Payer: Self-pay | Source: Ambulatory Visit | Attending: Family Medicine | Admitting: Family Medicine

## 2024-10-14 DIAGNOSIS — Z780 Asymptomatic menopausal state: Secondary | ICD-10-CM | POA: Diagnosis not present

## 2024-10-14 DIAGNOSIS — M8580 Other specified disorders of bone density and structure, unspecified site: Secondary | ICD-10-CM | POA: Diagnosis present

## 2024-10-14 DIAGNOSIS — M8589 Other specified disorders of bone density and structure, multiple sites: Secondary | ICD-10-CM | POA: Diagnosis not present

## 2024-10-23 DIAGNOSIS — I10 Essential (primary) hypertension: Secondary | ICD-10-CM | POA: Diagnosis not present

## 2024-10-23 DIAGNOSIS — Z79899 Other long term (current) drug therapy: Secondary | ICD-10-CM | POA: Diagnosis not present

## 2024-10-23 DIAGNOSIS — E559 Vitamin D deficiency, unspecified: Secondary | ICD-10-CM | POA: Diagnosis not present

## 2024-10-23 DIAGNOSIS — E538 Deficiency of other specified B group vitamins: Secondary | ICD-10-CM | POA: Diagnosis not present

## 2024-10-23 DIAGNOSIS — E118 Type 2 diabetes mellitus with unspecified complications: Secondary | ICD-10-CM | POA: Diagnosis not present

## 2024-10-23 DIAGNOSIS — E7800 Pure hypercholesterolemia, unspecified: Secondary | ICD-10-CM | POA: Diagnosis not present

## 2024-10-23 DIAGNOSIS — M89319 Hypertrophy of bone, unspecified shoulder: Secondary | ICD-10-CM | POA: Diagnosis not present

## 2024-10-23 DIAGNOSIS — Z23 Encounter for immunization: Secondary | ICD-10-CM | POA: Diagnosis not present

## 2024-10-27 DIAGNOSIS — M19012 Primary osteoarthritis, left shoulder: Secondary | ICD-10-CM | POA: Diagnosis not present

## 2024-10-27 DIAGNOSIS — M89319 Hypertrophy of bone, unspecified shoulder: Secondary | ICD-10-CM | POA: Diagnosis not present

## 2024-10-27 DIAGNOSIS — M19011 Primary osteoarthritis, right shoulder: Secondary | ICD-10-CM | POA: Diagnosis not present

## 2024-10-30 ENCOUNTER — Other Ambulatory Visit: Payer: Self-pay | Admitting: Family Medicine

## 2024-10-30 DIAGNOSIS — Z1231 Encounter for screening mammogram for malignant neoplasm of breast: Secondary | ICD-10-CM

## 2024-11-03 DIAGNOSIS — M25512 Pain in left shoulder: Secondary | ICD-10-CM | POA: Diagnosis not present

## 2024-11-10 DIAGNOSIS — I10 Essential (primary) hypertension: Secondary | ICD-10-CM | POA: Diagnosis not present

## 2024-11-10 DIAGNOSIS — E7800 Pure hypercholesterolemia, unspecified: Secondary | ICD-10-CM | POA: Diagnosis not present

## 2024-11-10 DIAGNOSIS — Z79899 Other long term (current) drug therapy: Secondary | ICD-10-CM | POA: Diagnosis not present

## 2024-11-10 DIAGNOSIS — E559 Vitamin D deficiency, unspecified: Secondary | ICD-10-CM | POA: Diagnosis not present

## 2024-11-10 DIAGNOSIS — E538 Deficiency of other specified B group vitamins: Secondary | ICD-10-CM | POA: Diagnosis not present

## 2024-11-10 DIAGNOSIS — E118 Type 2 diabetes mellitus with unspecified complications: Secondary | ICD-10-CM | POA: Diagnosis not present

## 2024-11-27 ENCOUNTER — Inpatient Hospital Stay: Admission: RE | Admit: 2024-11-27 | Discharge: 2024-11-27

## 2024-11-27 DIAGNOSIS — Z1231 Encounter for screening mammogram for malignant neoplasm of breast: Secondary | ICD-10-CM
